# Patient Record
Sex: Male | Born: 1990 | Race: Black or African American | Hispanic: No | Marital: Single | Smoking: Current every day smoker
Health system: Southern US, Community
[De-identification: ages and names within clinical notes are randomized; demographics above are authoritative.]

## PROBLEM LIST (undated history)

## (undated) DIAGNOSIS — J189 Pneumonia, unspecified organism: Secondary | ICD-10-CM

## (undated) DIAGNOSIS — G43909 Migraine, unspecified, not intractable, without status migrainosus: Secondary | ICD-10-CM

## (undated) DIAGNOSIS — Z21 Asymptomatic human immunodeficiency virus [HIV] infection status: Secondary | ICD-10-CM

## (undated) DIAGNOSIS — Z8709 Personal history of other diseases of the respiratory system: Secondary | ICD-10-CM

## (undated) DIAGNOSIS — B20 Human immunodeficiency virus [HIV] disease: Secondary | ICD-10-CM

## (undated) DIAGNOSIS — Z87442 Personal history of urinary calculi: Secondary | ICD-10-CM

## (undated) DIAGNOSIS — J45909 Unspecified asthma, uncomplicated: Secondary | ICD-10-CM

## (undated) DIAGNOSIS — R0600 Dyspnea, unspecified: Secondary | ICD-10-CM

## (undated) HISTORY — PX: NO PAST SURGERIES: SHX2092

---

## 2018-01-07 ENCOUNTER — Encounter (HOSPITAL_COMMUNITY): Payer: Self-pay | Admitting: Emergency Medicine

## 2018-01-07 ENCOUNTER — Emergency Department (HOSPITAL_COMMUNITY)
Admission: EM | Admit: 2018-01-07 | Discharge: 2018-01-08 | Disposition: A | Discharge status: Home / Self Care | Payer: Self-pay | Attending: Emergency Medicine | Admitting: Emergency Medicine

## 2018-01-07 ENCOUNTER — Other Ambulatory Visit: Payer: Self-pay

## 2018-01-07 DIAGNOSIS — N200 Calculus of kidney: Secondary | ICD-10-CM

## 2018-01-07 DIAGNOSIS — Z21 Asymptomatic human immunodeficiency virus [HIV] infection status: Secondary | ICD-10-CM

## 2018-01-07 DIAGNOSIS — J45909 Unspecified asthma, uncomplicated: Secondary | ICD-10-CM | POA: Insufficient documentation

## 2018-01-07 DIAGNOSIS — R319 Hematuria, unspecified: Secondary | ICD-10-CM

## 2018-01-07 DIAGNOSIS — B20 Human immunodeficiency virus [HIV] disease: Secondary | ICD-10-CM

## 2018-01-07 DIAGNOSIS — Z8619 Personal history of other infectious and parasitic diseases: Secondary | ICD-10-CM | POA: Insufficient documentation

## 2018-01-07 DIAGNOSIS — F17210 Nicotine dependence, cigarettes, uncomplicated: Secondary | ICD-10-CM | POA: Insufficient documentation

## 2018-01-07 DIAGNOSIS — N3001 Acute cystitis with hematuria: Secondary | ICD-10-CM

## 2018-01-07 HISTORY — DX: Unspecified asthma, uncomplicated: J45.909

## 2018-01-07 HISTORY — DX: Human immunodeficiency virus (HIV) disease: B20

## 2018-01-07 HISTORY — DX: Asymptomatic human immunodeficiency virus (hiv) infection status: Z21

## 2018-01-07 HISTORY — DX: Migraine, unspecified, not intractable, without status migrainosus: G43.909

## 2018-01-07 LAB — URINALYSIS, ROUTINE W REFLEX MICROSCOPIC
BACTERIA UA: NONE SEEN
Squamous Epithelial / LPF: NONE SEEN

## 2018-01-07 NOTE — ED Triage Notes (Signed)
Pt reports hematuria started today. Denies dysuria, abd pain, testicle swelling, N/V/D. Pt states he has HIV.

## 2018-01-08 ENCOUNTER — Emergency Department (HOSPITAL_COMMUNITY): Payer: Self-pay

## 2018-01-08 MED ORDER — CIPROFLOXACIN HCL 500 MG PO TABS: 500.0000 mg | ORAL_TABLET | Freq: Two times a day (BID) | ORAL | 0 refills | Status: AC

## 2018-01-08 MED ORDER — HYDROCODONE-ACETAMINOPHEN 5-325 MG PO TABS: 1.0000 | ORAL_TABLET | Freq: Four times a day (QID) | ORAL | 0 refills | Status: AC | PRN

## 2018-01-08 NOTE — Discharge Instructions (Signed)
Cipro as prescribed.  Hydrocodone is prescribed as needed for pain.  Plenty of fluids.  Follow-up with urology if not improving in the next 3 days.  The contact information for [facility] has been provided in this discharge summary for you to call and make these arrangements.  Return to the ER if symptoms significantly worsen or change.

## 2018-01-08 NOTE — ED Provider Notes (Signed)
[facility] EMERGENCY DEPARTMENT Provider Note   CSN: [id number] Arrival date & time: 01/07/18  2140     History   Chief Complaint Chief Complaint  Patient presents with  . Hematuria    HPI John Navarro is a 27 y.o. male.  Patient is a 27 year old male with past medical history of HIV disease.  He presents today for evaluation of hematuria.  Yesterday he states he urinated and noticed that his urine was red in color.  He denies any burning with urination.  He denies any fevers or chills.  He does report an episode of back pain yesterday which has since resolved.   The history is provided by the patient.  Hematuria  This is a new problem. The current episode started yesterday. Episode frequency: Every urination. Nothing aggravates the symptoms. Nothing relieves the symptoms. He has tried nothing for the symptoms.    Past Medical History:  Diagnosis Date  . Asthma   . HIV (human immunodeficiency virus infection) (HCC)   . Migraines     There are no active problems to display for this patient.   History reviewed. No pertinent surgical history.     Home Medications    Prior to Admission medications   Not on File    Family History History reviewed. No pertinent family history.  Social History Social History   Tobacco Use  . Smoking status: Current Every Day Smoker    Packs/day: 0.50    Types: Cigarettes  . Smokeless tobacco: Never Used  Substance Use Topics  . Alcohol use: Yes    Alcohol/week: 1.2 oz    Types: 2 Shots of liquor per week  . Drug use: Yes    Frequency: 2.0 times per week    Types: Marijuana     Allergies   Latex   Review of Systems Review of Systems  Genitourinary: Positive for hematuria.  All other systems reviewed and are negative.    Physical Exam Updated Vital Signs BP 116/82 (BP Location: Left Arm)   Pulse 94   Temp 98.5 F (36.9 C) (Oral)   Resp 18   Ht 5\' 6"  (1.676 m)   Wt 78.4 kg (172 lb 14.4 oz)   SpO2 98%    BMI 27.91 kg/m   Physical Exam  Constitutional: He is oriented to person, place, and time. He appears well-developed and well-nourished. No distress.  HENT:  Head: Normocephalic and atraumatic.  Mouth/Throat: Oropharynx is clear and moist.  Neck: Normal range of motion. Neck supple.  Cardiovascular: Normal rate and regular rhythm. Exam reveals no friction rub.  No murmur heard. Pulmonary/Chest: Effort normal and breath sounds normal. No respiratory distress. He has no wheezes. He has no rales.  Abdominal: Soft. Bowel sounds are normal. He exhibits no distension. There is no tenderness.  Musculoskeletal: Normal range of motion. He exhibits no edema.  Neurological: He is alert and oriented to person, place, and time. Coordination normal.  Skin: Skin is warm and dry. He is not diaphoretic.  Nursing note and vitals reviewed.    ED Treatments / Results  Labs (all labs ordered are listed, but only abnormal results are displayed) Labs Reviewed  URINALYSIS, ROUTINE W REFLEX MICROSCOPIC - Abnormal; Notable for the following components:      Result Value   Color, Urine RED (*)    APPearance TURBID (*)    Glucose, UA   (*)    Value: TEST NOT REPORTED DUE TO COLOR INTERFERENCE OF URINE PIGMENT   Hgb  urine dipstick   (*)    Value: TEST NOT REPORTED DUE TO COLOR INTERFERENCE OF URINE PIGMENT   Bilirubin Urine   (*)    Value: TEST NOT REPORTED DUE TO COLOR INTERFERENCE OF URINE PIGMENT   Ketones, ur   (*)    Value: TEST NOT REPORTED DUE TO COLOR INTERFERENCE OF URINE PIGMENT   Protein, ur   (*)    Value: TEST NOT REPORTED DUE TO COLOR INTERFERENCE OF URINE PIGMENT   Nitrite   (*)    Value: TEST NOT REPORTED DUE TO COLOR INTERFERENCE OF URINE PIGMENT   Leukocytes, UA   (*)    Value: TEST NOT REPORTED DUE TO COLOR INTERFERENCE OF URINE PIGMENT   All other components within normal limits  URINE CULTURE    EKG  EKG Interpretation None       Radiology No results  found.  Procedures Procedures (including critical care time)  Medications Ordered in ED Medications - No data to display   Initial Impression / Assessment and Plan / ED Course  I have reviewed the triage vital signs and the nursing notes.  Pertinent labs & imaging results that were available during my care of the patient were reviewed by me and considered in my medical decision making (see chart for details).  CT scan shows no obstructive uropathy.  Urinalysis most consistent with a hemorrhagic cystitis.  This will be treated with antibiotics and follow-up as needed.  Final Clinical Impressions(s) / ED Diagnoses   Final diagnoses:  None    ED Discharge Orders    None       Geoffery Lyons, MD 01/08/18 [phone]

## 2018-01-09 LAB — URINE CULTURE

## 2018-01-24 ENCOUNTER — Encounter (HOSPITAL_COMMUNITY): Payer: Self-pay | Admitting: Emergency Medicine

## 2018-01-24 ENCOUNTER — Emergency Department (HOSPITAL_COMMUNITY)
Admission: EM | Admit: 2018-01-24 | Discharge: 2018-01-24 | Disposition: A | Discharge status: Home / Self Care | Payer: Self-pay | Attending: Emergency Medicine | Admitting: Emergency Medicine

## 2018-01-24 ENCOUNTER — Other Ambulatory Visit: Payer: Self-pay

## 2018-01-24 ENCOUNTER — Emergency Department (HOSPITAL_COMMUNITY): Payer: Self-pay

## 2018-01-24 DIAGNOSIS — F17210 Nicotine dependence, cigarettes, uncomplicated: Secondary | ICD-10-CM | POA: Insufficient documentation

## 2018-01-24 DIAGNOSIS — M791 Myalgia, unspecified site: Secondary | ICD-10-CM | POA: Insufficient documentation

## 2018-01-24 DIAGNOSIS — B20 Human immunodeficiency virus [HIV] disease: Secondary | ICD-10-CM | POA: Insufficient documentation

## 2018-01-24 DIAGNOSIS — R0981 Nasal congestion: Secondary | ICD-10-CM | POA: Insufficient documentation

## 2018-01-24 DIAGNOSIS — R110 Nausea: Secondary | ICD-10-CM | POA: Insufficient documentation

## 2018-01-24 DIAGNOSIS — R319 Hematuria, unspecified: Secondary | ICD-10-CM | POA: Insufficient documentation

## 2018-01-24 DIAGNOSIS — R61 Generalized hyperhidrosis: Secondary | ICD-10-CM | POA: Insufficient documentation

## 2018-01-24 DIAGNOSIS — R6883 Chills (without fever): Secondary | ICD-10-CM | POA: Insufficient documentation

## 2018-01-24 DIAGNOSIS — J40 Bronchitis, not specified as acute or chronic: Secondary | ICD-10-CM | POA: Insufficient documentation

## 2018-01-24 DIAGNOSIS — F1210 Cannabis abuse, uncomplicated: Secondary | ICD-10-CM | POA: Insufficient documentation

## 2018-01-24 DIAGNOSIS — Z91040 Latex allergy status: Secondary | ICD-10-CM | POA: Insufficient documentation

## 2018-01-24 LAB — URINALYSIS, ROUTINE W REFLEX MICROSCOPIC
BILIRUBIN URINE: NEGATIVE
Bacteria, UA: NONE SEEN
GLUCOSE, UA: NEGATIVE mg/dL
KETONES UR: 5 mg/dL — AB
Nitrite: NEGATIVE
PH: 5 (ref 5.0–8.0)
PROTEIN: 30 mg/dL — AB
Specific Gravity, Urine: 1.028 (ref 1.005–1.030)

## 2018-01-24 MED ORDER — ALBUTEROL SULFATE HFA 108 (90 BASE) MCG/ACT IN AERS
INHALATION_SPRAY | RESPIRATORY_TRACT | Status: AC
  Filled 2018-01-24: qty 6.7

## 2018-01-24 MED ORDER — ALBUTEROL SULFATE HFA 108 (90 BASE) MCG/ACT IN AERS
2.0000 | INHALATION_SPRAY | Freq: Once | RESPIRATORY_TRACT | Status: AC
  Administered 2018-01-24: 2 via RESPIRATORY_TRACT

## 2018-01-24 MED ORDER — BENZONATATE 100 MG PO CAPS: 200.0000 mg | ORAL_CAPSULE | Freq: Three times a day (TID) | ORAL | 0 refills | Status: AC | PRN

## 2018-01-24 MED ORDER — PREDNISONE 20 MG PO TABS: 40.0000 mg | ORAL_TABLET | Freq: Every day | ORAL | 0 refills | Status: AC

## 2018-01-24 NOTE — Discharge Instructions (Signed)
1-2 puffs of the albuterol inhaler every 4-6 hours as needed.  Drink plenty of fluids.  Tylenol ibuprofen if needed for fever and/or body aches.  As discussed, contact the urologist group listed to arrange a follow-up appointment.

## 2018-01-24 NOTE — ED Triage Notes (Signed)
Flu like symptoms since Monday. Nasal congestion started first, coughing and now nausea and bodyaches.

## 2018-01-25 NOTE — ED Provider Notes (Signed)
[facility] EMERGENCY DEPARTMENT Provider Note   CSN: [id number] Arrival date & time: 01/24/18  1041     History   Chief Complaint Chief Complaint  Patient presents with  . Cough    flu like symptoms    HPI John Navarro is a 28 y.o. male.  HPI  John Navarro is a 27 y.o. male who presents to the Emergency Department complaining of nasal congestion, cough, generalized body aches and nausea.  Symptoms present for 2 days.  Cough has been non-productive.  Possible sick contacts.  Admits to chills and intermittent sweats, but no known fever.  He has not tried any medications for symptom relief.  He denies shortness of breath, chest pain, abdominal pain, vomiting, and sore throat.  He also requests to have his urine rechecked.  He states that he was here two weeks ago and was noted to have blood in his urine and was advised to follow up with urology, but has not scheduled an appt.  Denies urinary symptoms.     Past Medical History:  Diagnosis Date  . Asthma   . HIV (human immunodeficiency virus infection) (HCC)   . Migraines     There are no active problems to display for this patient.   History reviewed. No pertinent surgical history.     Home Medications    Prior to Admission medications   Medication Sig Start Date End Date Taking? Authorizing Provider  benzonatate (TESSALON) 100 MG capsule Take 2 capsules (200 mg total) by mouth 3 (three) times daily as needed for cough. Swallow whole, do not chew 01/24/18   Essence Merle, PA-C  ciprofloxacin (CIPRO) 500 MG tablet Take 1 tablet (500 mg total) by mouth 2 (two) times daily. One po bid x 7 days 01/08/18   Geoffery Lyons, MD  HYDROcodone-acetaminophen (NORCO) 5-325 MG tablet Take 1-2 tablets by mouth every 6 (six) hours as needed. 01/08/18   Geoffery Lyons, MD  predniSONE (DELTASONE) 20 MG tablet Take 2 tablets (40 mg total) by mouth daily. For 5 days 01/24/18   Pauline Aus, PA-C    Family History History reviewed. No  pertinent family history.  Social History Social History   Tobacco Use  . Smoking status: Current Every Day Smoker    Packs/day: 0.50    Types: Cigarettes  . Smokeless tobacco: Never Used  Substance Use Topics  . Alcohol use: Yes    Alcohol/week: 1.2 oz    Types: 2 Shots of liquor per week  . Drug use: Yes    Frequency: 2.0 times per week    Types: Marijuana     Allergies   Latex   Review of Systems Review of Systems  Constitutional: Positive for chills. Negative for activity change, appetite change and fever.  HENT: Positive for congestion. Negative for facial swelling, rhinorrhea, sore throat and trouble swallowing.   Eyes: Negative for visual disturbance.  Respiratory: Positive for cough. Negative for shortness of breath, wheezing and stridor.   Cardiovascular: Negative for chest pain.  Gastrointestinal: Positive for nausea. Negative for abdominal pain and vomiting.  Genitourinary: Positive for hematuria. Negative for dysuria and frequency.  Musculoskeletal: Positive for myalgias. Negative for neck pain and neck stiffness.  Skin: Negative for rash.  Neurological: Negative for dizziness, weakness, numbness and headaches.  Hematological: Negative for adenopathy.  Psychiatric/Behavioral: Negative for confusion.  All other systems reviewed and are negative.    Physical Exam Updated Vital Signs BP 126/84 (BP Location: Left Arm)   Pulse (!) 101  Temp 98.8 F (37.1 C) (Oral)   Resp 18   Ht 5' 6.5" (1.689 m)   Wt 78 kg (172 lb)   SpO2 98%   BMI 27.35 kg/m   Physical Exam  Constitutional: He is oriented to person, place, and time. He appears well-developed and well-nourished. No distress.  HENT:  Head: Normocephalic and atraumatic.  Right Ear: Tympanic membrane and ear canal normal.  Left Ear: Tympanic membrane and ear canal normal.  Nose: Mucosal edema and rhinorrhea present.  Mouth/Throat: Uvula is midline and mucous membranes are normal. No trismus in the  jaw. No uvula swelling. No oropharyngeal exudate, posterior oropharyngeal edema, posterior oropharyngeal erythema or tonsillar abscesses.  Eyes: Conjunctivae are normal.  Neck: Normal range of motion and phonation normal. Neck supple. No Kernig's sign noted.  Cardiovascular: Normal rate, regular rhythm and intact distal pulses.  No murmur heard. Pulmonary/Chest: Effort normal and breath sounds normal. No respiratory distress. He has no wheezes. He has no rales.  Abdominal: Soft. He exhibits no distension. There is no tenderness. There is no rebound, no guarding and no CVA tenderness.  Musculoskeletal: Normal range of motion. He exhibits no edema.  Lymphadenopathy:    He has no cervical adenopathy.  Neurological: He is alert and oriented to person, place, and time. No sensory deficit. He exhibits normal muscle tone. Coordination normal.  Skin: Skin is warm and dry.  Psychiatric: He has a normal mood and affect.  Nursing note and vitals reviewed.    ED Treatments / Results  Labs (all labs ordered are listed, but only abnormal results are displayed) Labs Reviewed  URINALYSIS, ROUTINE W REFLEX MICROSCOPIC - Abnormal; Notable for the following components:      Result Value   Hgb urine dipstick MODERATE (*)    Ketones, ur 5 (*)    Protein, ur 30 (*)    Leukocytes, UA TRACE (*)    Squamous Epithelial / LPF 0-5 (*)    All other components within normal limits  URINE CULTURE    EKG  EKG Interpretation None       Radiology Dg Chest 2 View  Result Date: 01/24/2018 CLINICAL DATA:  Cough, congestion, sob, body aches and fever x 2 days. EXAM: CHEST  2 VIEW COMPARISON:  None. FINDINGS: Heart size is normal. There is mild perihilar peribronchial thickening. No focal consolidations or pleural effusions. No pulmonary edema. IMPRESSION: Mild bronchitic changes. Electronically Signed   By: Norva Pavlov M.D.   On: 01/24/2018 11:23    Procedures Procedures (including critical care  time)  Medications Ordered in ED Medications  albuterol (PROVENTIL HFA;VENTOLIN HFA) 108 (90 Base) MCG/ACT inhaler 2 puff (2 puffs Inhalation Given 01/24/18 1305)     Initial Impression / Assessment and Plan / ED Course  I have reviewed the triage vital signs and the nursing notes.  Pertinent labs & imaging results that were available during my care of the patient were reviewed by me and considered in my medical decision making (see chart for details).     Pt is well appearing.  Vitals reviewed.  CXR shows bronchitic changes.  U/A performed at pt request.  Continues to have painless hematuria.  Advised pt of importance of urology f/u.  He verbalized understanding and agrees to arrange f/u.    Final Clinical Impressions(s) / ED Diagnoses   Final diagnoses:  Bronchitis  Hematuria, unspecified type    ED Discharge Orders        Ordered    predniSONE (DELTASONE) 20  MG tablet  Daily     01/24/18 1255    benzonatate (TESSALON) 100 MG capsule  3 times daily PRN     01/24/18 1255       Christoper Bushey, Babette Relic, PA-C 01/25/18 2219    Bethann Berkshire, MD 01/26/18 0830

## 2018-01-26 LAB — URINE CULTURE

## 2018-02-19 ENCOUNTER — Emergency Department (HOSPITAL_COMMUNITY)
Admission: EM | Admit: 2018-02-19 | Discharge: 2018-02-19 | Disposition: A | Discharge status: Home / Self Care | Payer: Self-pay | Attending: Emergency Medicine | Admitting: Emergency Medicine

## 2018-02-19 ENCOUNTER — Encounter (HOSPITAL_COMMUNITY): Payer: Self-pay | Admitting: Emergency Medicine

## 2018-02-19 DIAGNOSIS — R070 Pain in throat: Secondary | ICD-10-CM | POA: Insufficient documentation

## 2018-02-19 DIAGNOSIS — R0981 Nasal congestion: Secondary | ICD-10-CM | POA: Insufficient documentation

## 2018-02-19 DIAGNOSIS — Z79899 Other long term (current) drug therapy: Secondary | ICD-10-CM | POA: Insufficient documentation

## 2018-02-19 DIAGNOSIS — F17210 Nicotine dependence, cigarettes, uncomplicated: Secondary | ICD-10-CM | POA: Insufficient documentation

## 2018-02-19 DIAGNOSIS — J45909 Unspecified asthma, uncomplicated: Secondary | ICD-10-CM | POA: Insufficient documentation

## 2018-02-19 DIAGNOSIS — J029 Acute pharyngitis, unspecified: Secondary | ICD-10-CM

## 2018-02-19 DIAGNOSIS — Z91040 Latex allergy status: Secondary | ICD-10-CM | POA: Insufficient documentation

## 2018-02-19 DIAGNOSIS — B20 Human immunodeficiency virus [HIV] disease: Secondary | ICD-10-CM | POA: Insufficient documentation

## 2018-02-19 LAB — RAPID STREP SCREEN (MED CTR MEBANE ONLY): STREPTOCOCCUS, GROUP A SCREEN (DIRECT): NEGATIVE

## 2018-02-19 MED ORDER — AMOXICILLIN 500 MG PO CAPS: 500.0000 mg | ORAL_CAPSULE | Freq: Two times a day (BID) | ORAL | 0 refills | Status: AC

## 2018-02-19 MED ORDER — AMOXICILLIN 500 MG PO CAPS
500.0000 mg | ORAL_CAPSULE | Freq: Once | ORAL | Status: AC
Start: 2018-02-19 — End: 2018-02-19
  Administered 2018-02-19: 500 mg via ORAL
  Filled 2018-02-19: qty 1

## 2018-02-19 MED ORDER — DEXAMETHASONE SODIUM PHOSPHATE 10 MG/ML IJ SOLN
10.0000 mg | Freq: Once | INTRAMUSCULAR | Status: AC
  Administered 2018-02-19: 10 mg via INTRAMUSCULAR
  Filled 2018-02-19: qty 1

## 2018-02-19 MED ORDER — FLUTICASONE PROPIONATE 50 MCG/ACT NA SUSP: 2.0000 | Freq: Every day | NASAL | 0 refills | Status: AC

## 2018-02-19 NOTE — Discharge Instructions (Signed)
Please take all of your antibiotics until finished!   You may develop abdominal discomfort or diarrhea from the antibiotic.  You may help offset this with probiotics which you can buy or get in yogurt. Do not eat  or take the probiotics until 2 hours after your antibiotic.   Plenty of water and get plenty of rest. Gargle warm salt water and spit it out for sore throat. May also use cough drops, warm teas, over-the-counter sprays such as Chloraseptic spray, etc. Take flonase to decrease nasal congestion.  Over-the-counter cold or allergy medication for nasal congestion and scratchy throat. Alternate 600 mg of ibuprofen and [phone] mg of Tylenol every 3 hours as needed for pain. Do not exceed 4000 mg of Tylenol daily.   Followup with your primary care doctor or infectious disease doctor in 3-5 days for recheck of ongoing symptoms. Return to emergency department for emergent changing or worsening of symptoms such as throat tightness, facial swelling, not able to tolerate fluids, fever not controlled by ibuprofen or Tylenol,difficulty breathing, or chest pain.

## 2018-02-19 NOTE — ED Notes (Signed)
Provider notified specimen unable to be found by lab after being sent by nurse via tube station.

## 2018-02-19 NOTE — ED Triage Notes (Signed)
Pt complains of sore throat for three days. Pt states his "body hurts". Pt also complains of nasal congestion/ nonproductive cough

## 2018-02-19 NOTE — ED Notes (Signed)
Called lab states has not received specimen for strep test.

## 2018-02-19 NOTE — ED Provider Notes (Signed)
[facility] EMERGENCY DEPARTMENT Provider Note   CSN: [id number] Arrival date & time: 02/19/18  1147     History   Chief Complaint Chief Complaint  Patient presents with  . Sore Throat    HPI John Navarro is a 27 y.o. male with multiple asthma, HIV, migraines presents today for evaluation of gradual onset, progressively worsening sore throat for 5 days.  Patient notes he developed a hoarse voice last night.  He thinks this was as a result of yelling and singing in church yesterday morning. No drooling while awake.  He states that his pain was most severe when his symptoms began on Thursday and Friday but have improved.  He denies fevers or chills.  He notes nasal congestion and nonproductive cough.  Denies chest pain or shortness of breath.  He has tried hot tea and a shot of liquor without significant relief of his symptoms.  He notes generalized myalgias but denies neck stiffness.  He states he is able to tolerate p.o. fluids although it is painful to swallow.  His infectious disease doctor is in [place] and he states he has been largely compliant with his HIV medications.  He was last evaluated by them in November and his viral load has been undetectable for years per the patient.  The history is provided by the patient.    Past Medical History:  Diagnosis Date  . Asthma   . HIV (human immunodeficiency virus infection) (HCC)   . Migraines     There are no active problems to display for this patient.   History reviewed. No pertinent surgical history.      Home Medications    Prior to Admission medications   Medication Sig Start Date End Date Taking? Authorizing Provider  amoxicillin (AMOXIL) 500 MG capsule Take 1 capsule (500 mg total) by mouth 2 (two) times daily for 10 days. 02/19/18 03/01/18  Michela Pitcher A, PA-C  benzonatate (TESSALON) 100 MG capsule Take 2 capsules (200 mg total) by mouth 3 (three) times daily as needed for cough. Swallow whole, do  not chew 01/24/18   Triplett, Tammy, PA-C  ciprofloxacin (CIPRO) 500 MG tablet Take 1 tablet (500 mg total) by mouth 2 (two) times daily. One po bid x 7 days 01/08/18   Geoffery Lyons, MD  fluticasone [facility]) 50 MCG/ACT nasal spray Place 2 sprays into both nostrils daily. 02/19/18   Maurice Ramseur A, PA-C  HYDROcodone-acetaminophen (NORCO) 5-325 MG tablet Take 1-2 tablets by mouth every 6 (six) hours as needed. 01/08/18   Geoffery Lyons, MD  predniSONE (DELTASONE) 20 MG tablet Take 2 tablets (40 mg total) by mouth daily. For 5 days 01/24/18   Pauline Aus, PA-C    Family History History reviewed. No pertinent family history.  Social History Social History   Tobacco Use  . Smoking status: Current Every Day Smoker    Packs/day: 0.50    Types: Cigarettes  . Smokeless tobacco: Never Used  Substance Use Topics  . Alcohol use: Yes    Alcohol/week: 1.2 oz    Types: 2 Shots of liquor per week  . Drug use: Yes    Frequency: 2.0 times per week    Types: Marijuana     Allergies   Latex   Review of Systems Review of Systems  Constitutional: Negative for chills and fever.  HENT: Positive for congestion, drooling, sore throat, trouble swallowing and voice change.   Respiratory: Positive for cough. Negative for shortness of breath.   Cardiovascular:  Negative for chest pain.  Gastrointestinal: Negative for abdominal pain, nausea and vomiting.  Musculoskeletal: Negative for neck pain and neck stiffness.     Physical Exam Updated Vital Signs BP 119/69   Pulse 92   Temp 98.5 F (36.9 C) (Oral)   Resp 16   Ht 5\' 6"  (1.676 m)   Wt 78 kg (172 lb)   SpO2 100%   BMI 27.76 kg/m   Physical Exam  Constitutional: He appears well-developed and well-nourished. No distress.  HENT:  Head: Normocephalic and atraumatic.  Right Ear: Tympanic membrane and ear canal normal.  Left Ear: Tympanic membrane and ear canal normal.  Mouth/Throat: Uvula is midline and mucous membranes are normal. No uvula  swelling. Posterior oropharyngeal edema and posterior oropharyngeal erythema present. No tonsillar abscesses. Tonsils are 2+ on the right. Tonsils are 2+ on the left. Tonsillar exudate.  Nasal septum midline with mucosal edema bilaterally.  No frontal or maxillary sinus tenderness.  No swelling of the lips or tongue.  Tolerating secretions without difficulty.  Speaking with a hoarse soft voice.  Eyes: Pupils are equal, round, and reactive to light. Conjunctivae and EOM are normal. Right eye exhibits no discharge. Left eye exhibits no discharge.  Neck: Normal range of motion. Neck supple. No JVD present. No tracheal deviation present.  Bilateral anterior cervical lymphadenopathy.  He experiences some pain to the anterior neck with extension and lateral rotation.  No stridor on auscultation  Cardiovascular: Normal rate, regular rhythm and normal heart sounds.  Pulmonary/Chest: Effort normal and breath sounds normal. No respiratory distress. He has no wheezes. He has no rhonchi. He has no rales. He exhibits no tenderness.  Equal rise and fall of chest, no increased work of breathing  Abdominal: Soft. Bowel sounds are normal. He exhibits no distension.  Musculoskeletal: He exhibits no edema.  Lymphadenopathy:    He has cervical adenopathy.  Neurological: He is alert.  Skin: Skin is warm and dry. No erythema.  Psychiatric: He has a normal mood and affect. His behavior is normal.  Nursing note and vitals reviewed.    ED Treatments / Results  Labs (all labs ordered are listed, but only abnormal results are displayed) Labs Reviewed  RAPID STREP SCREEN (NOT AT [facility])    EKG None  Radiology No results found.  Procedures Procedures (including critical care time)  Medications Ordered in ED Medications  dexamethasone (DECADRON) injection 10 mg (10 mg Intramuscular Given 02/19/18 1358)  amoxicillin (AMOXIL) capsule 500 mg (500 mg Oral Given 02/19/18 1407)     Initial Impression / Assessment  and Plan / ED Course  I have reviewed the triage vital signs and the nursing notes.  Pertinent labs & imaging results that were available during my care of the patient were reviewed by me and considered in my medical decision making (see chart for details).     Patient presents with 4-day history of sore throat and nasal congestion.  Pain is improving but he did develop some hoarseness to his voice yesterday after singing at church.  He is afebrile, vital signs are stable.  He is nontoxic in appearance.  He does speak with a soft hoarse voice but is tolerating secretions without difficulty and has no upper airway stridor.  He has tonsillar exudate, cervical lymphadenopathy, & dysphagia; diagnosis of strep. Treated in the Ed with steroids.  He exhibits no meningeal signs and I doubt meningitis.  Presentation is not concerning for PTA.  However, with history of some drooling at night  and hoarse voice which developed yesterday I did recommend CT scan of the neck for evaluation of spread of infection to soft tissue.  Patient refuses scan at this time.  I did have an extensive discussion regarding the risks of not working up his sore throat entirely and he verbalizes understanding of the assumed risk.  He would prefer to hold off on imaging at this time.  He is HIV positive but states he has been largely compliant with his HIV medications and his viral load has been undetectable for several years. He is able to tolerate secretions without difficulty and is speaking in full sentences and protecting his airway. Pt appears mildly dehydrated, discussed importance of water rehydration. No trismus or uvula deviation.  Discussed strict ED return precautions.  Pt able to drink water in ED without difficulty with intact air way. Recommended PCP follow up and gave [place]  Patient and patient's significant other verbalized understanding of and agreement with plan and  patient is stable for discharge home at this time.  Final Clinical Impressions(s) / ED Diagnoses   Final diagnoses:  Sore throat  Nasal congestion    ED Discharge Orders        Ordered    amoxicillin (AMOXIL) 500 MG capsule  2 times daily     02/19/18 1404    fluticasone (FLONASE) 50 MCG/ACT nasal spray  Daily     02/19/18 1404       Jeanie SewerFawze, Amali Uhls A, PA-C 02/19/18 1444    Eber HongMiller, Brian, MD 02/19/18 2135

## 2018-02-21 LAB — CULTURE, GROUP A STREP (THRC)

## 2018-02-23 ENCOUNTER — Other Ambulatory Visit: Payer: Self-pay

## 2018-03-02 ENCOUNTER — Encounter: Payer: Self-pay | Admitting: Infectious Diseases

## 2018-03-12 ENCOUNTER — Ambulatory Visit: Payer: Self-pay | Admitting: Infectious Diseases

## 2018-03-12 ENCOUNTER — Ambulatory Visit: Payer: Self-pay

## 2018-03-14 ENCOUNTER — Telehealth: Payer: Self-pay

## 2018-03-14 ENCOUNTER — Ambulatory Visit: Payer: Self-pay

## 2018-03-14 ENCOUNTER — Ambulatory Visit: Payer: Self-pay | Admitting: Infectious Diseases

## 2018-03-14 NOTE — Telephone Encounter (Signed)
Called pt today to remind him of his appt with Dr. Ninetta LightsHatcher. Left a brief message reminding him of his appt and left clinics phone number incase he needed directions or had questions for the office.  Lorenso CourierJose L Maldonado, [place]

## 2018-03-22 ENCOUNTER — Other Ambulatory Visit: Payer: Self-pay | Admitting: *Deleted

## 2018-03-22 ENCOUNTER — Other Ambulatory Visit: Payer: Self-pay

## 2018-03-22 DIAGNOSIS — B20 Human immunodeficiency virus [HIV] disease: Secondary | ICD-10-CM

## 2018-03-22 DIAGNOSIS — Z113 Encounter for screening for infections with a predominantly sexual mode of transmission: Secondary | ICD-10-CM

## 2018-03-22 DIAGNOSIS — Z79899 Other long term (current) drug therapy: Secondary | ICD-10-CM

## 2018-03-25 ENCOUNTER — Emergency Department (HOSPITAL_COMMUNITY)
Admission: EM | Admit: 2018-03-25 | Discharge: 2018-03-25 | Disposition: A | Discharge status: Home / Self Care | Payer: Self-pay | Attending: Emergency Medicine | Admitting: Emergency Medicine

## 2018-03-25 ENCOUNTER — Other Ambulatory Visit: Payer: Self-pay

## 2018-03-25 ENCOUNTER — Encounter (HOSPITAL_COMMUNITY): Payer: Self-pay | Admitting: Emergency Medicine

## 2018-03-25 DIAGNOSIS — Z79899 Other long term (current) drug therapy: Secondary | ICD-10-CM | POA: Insufficient documentation

## 2018-03-25 DIAGNOSIS — F17210 Nicotine dependence, cigarettes, uncomplicated: Secondary | ICD-10-CM | POA: Insufficient documentation

## 2018-03-25 DIAGNOSIS — Z87442 Personal history of urinary calculi: Secondary | ICD-10-CM

## 2018-03-25 DIAGNOSIS — J45909 Unspecified asthma, uncomplicated: Secondary | ICD-10-CM | POA: Insufficient documentation

## 2018-03-25 DIAGNOSIS — R109 Unspecified abdominal pain: Secondary | ICD-10-CM

## 2018-03-25 DIAGNOSIS — N390 Urinary tract infection, site not specified: Secondary | ICD-10-CM | POA: Insufficient documentation

## 2018-03-25 LAB — BASIC METABOLIC PANEL
ANION GAP: 10 (ref 5–15)
BUN: 9 mg/dL (ref 6–20)
CHLORIDE: 104 mmol/L (ref 101–111)
CO2: 23 mmol/L (ref 22–32)
Calcium: 8.3 mg/dL — ABNORMAL LOW (ref 8.9–10.3)
Creatinine, Ser: 1.09 mg/dL (ref 0.61–1.24)
GFR calc Af Amer: >60 mL/min (ref >60)
GFR calc non Af Amer: >60 mL/min (ref >60)
GLUCOSE: 128 mg/dL — AB (ref 65–99)
POTASSIUM: 3.7 mmol/L (ref 3.5–5.1)
SODIUM: 137 mmol/L (ref 135–145)

## 2018-03-25 LAB — CBC
HEMATOCRIT: 40.4 % (ref 39.0–52.0)
HEMOGLOBIN: 13.7 g/dL (ref 13.0–17.0)
MCH: 29.4 pg (ref 26.0–34.0)
MCHC: 33.9 g/dL (ref 30.0–36.0)
MCV: 86.7 fL (ref 78.0–100.0)
Platelets: 286 10*3/uL (ref 150–400)
RBC: 4.66 MIL/uL (ref 4.22–5.81)
RDW: 13.7 % (ref 11.5–15.5)
WBC: 11.6 10*3/uL — AB (ref 4.0–10.5)

## 2018-03-25 LAB — URINALYSIS, ROUTINE W REFLEX MICROSCOPIC
Bacteria, UA: NONE SEEN
Bilirubin Urine: NEGATIVE
GLUCOSE, UA: NEGATIVE mg/dL
Ketones, ur: NEGATIVE mg/dL
NITRITE: NEGATIVE
Protein, ur: NEGATIVE mg/dL
RBC / HPF: >50 RBC/hpf — ABNORMAL HIGH (ref 0–5)
SPECIFIC GRAVITY, URINE: 1.015 (ref 1.005–1.030)
pH: 5 (ref 5.0–8.0)

## 2018-03-25 MED ORDER — TAMSULOSIN HCL 0.4 MG PO CAPS: 0.4000 mg | ORAL_CAPSULE | Freq: Every day | ORAL | 0 refills | Status: DC

## 2018-03-25 MED ORDER — DOXYCYCLINE HYCLATE 100 MG PO CAPS: 100.0000 mg | ORAL_CAPSULE | Freq: Two times a day (BID) | ORAL | 0 refills | Status: AC

## 2018-03-25 MED ORDER — IBUPROFEN 600 MG PO TABS: 600.0000 mg | ORAL_TABLET | Freq: Four times a day (QID) | ORAL | 0 refills | Status: AC | PRN

## 2018-03-25 NOTE — ED Provider Notes (Signed)
[facility] EMERGENCY DEPARTMENT Provider Note   CSN: [id number] Arrival date & time: 03/25/18  1549     History   Chief Complaint Chief Complaint  Patient presents with  . Flank Pain    HPI John Navarro is a 27 y.o. male.  HPI   27 year old male with history of HIV presenting complaining of flank pain.  Patient report last night he developed pain primarily to his left flank which radiates to his right flank.  Pain is sharp throbbing, moderate in severity and felt similar to the kidney stone that he had 2 months ago when he was diagnosed with kidney stones.  When the pain is intense, he was nauseous and vomit once.  States he saw a trace of blood in his vomitus.  He has not vomited since.  Denies any associated fever, chills, lightheadedness, dizziness, burning urination, urinary frequency or urgency, or penile discharge.  He did notice some blood in his urine.  Nothing seems to make the symptoms better or worse.  He is unable to recall last CD4 Viral load.  He also denies any penile pain or testicular pain.   Past Medical History:  Diagnosis Date  . Asthma   . HIV (human immunodeficiency virus infection) (HCC)   . Migraines     There are no active problems to display for this patient.   No past surgical history on file.      Home Medications    Prior to Admission medications   Medication Sig Start Date End Date Taking? Authorizing Provider  benzonatate (TESSALON) 100 MG capsule Take 2 capsules (200 mg total) by mouth 3 (three) times daily as needed for cough. Swallow whole, do not chew 01/24/18   Triplett, Tammy, PA-C  ciprofloxacin (CIPRO) 500 MG tablet Take 1 tablet (500 mg total) by mouth 2 (two) times daily. One po bid x 7 days 01/08/18   Geoffery Lyons, MD  fluticasone [facility]) 50 MCG/ACT nasal spray Place 2 sprays into both nostrils daily. 02/19/18   Fawze, Mina A, PA-C  HYDROcodone-acetaminophen (NORCO) 5-325 MG tablet Take 1-2 tablets by mouth  every 6 (six) hours as needed. 01/08/18   Geoffery Lyons, MD  predniSONE (DELTASONE) 20 MG tablet Take 2 tablets (40 mg total) by mouth daily. For 5 days 01/24/18   Pauline Aus, PA-C    Family History No family history on file.  Social History Social History   Tobacco Use  . Smoking status: Current Every Day Smoker    Packs/day: 0.50    Types: Cigarettes  . Smokeless tobacco: Never Used  Substance Use Topics  . Alcohol use: Yes    Alcohol/week: 1.2 oz    Types: 2 Shots of liquor per week  . Drug use: Yes    Frequency: 2.0 times per week    Types: Marijuana     Allergies   Latex   Review of Systems Review of Systems  All other systems reviewed and are negative.    Physical Exam Updated Vital Signs BP (!) 128/91 (BP Location: Right Arm)   Pulse 72   Temp 98.6 F (37 C) (Oral)   Resp 18   Ht  (1.702 m)   Wt 74.8 kg (165 lb)   SpO2 99%   BMI 25.84 kg/m   Physical Exam  Constitutional: He appears well-developed and well-nourished. No distress.  HENT:  Head: Atraumatic.  Eyes: Conjunctivae are normal.  Neck: Neck supple.  Cardiovascular: Normal rate and regular rhythm.  Pulmonary/Chest: Effort  normal and breath sounds normal.  Abdominal: Soft. Bowel sounds are normal. He exhibits no distension. There is no tenderness.  Genitourinary:  Genitourinary Comments: Left CVA tenderness on percussion  Neurological: He is alert.  Skin: No rash noted.  Psychiatric: He has a normal mood and affect.  Nursing note and vitals reviewed.    ED Treatments / Results  Labs (all labs ordered are listed, but only abnormal results are displayed) Labs Reviewed  URINALYSIS, ROUTINE W REFLEX MICROSCOPIC - Abnormal; Notable for the following components:      Result Value   Hgb urine dipstick MODERATE (*)    Leukocytes, UA SMALL (*)    RBC / HPF >50 (*)    All other components within normal limits  BASIC METABOLIC PANEL - Abnormal; Notable for the following components:    Glucose, Bld 128 (*)    Calcium 8.3 (*)    All other components within normal limits  CBC - Abnormal; Notable for the following components:   WBC 11.6 (*)    All other components within normal limits  URINE CULTURE    EKG None  Radiology No results found.  Procedures Procedures (including critical care time)  Medications Ordered in ED Medications - No data to display   Initial Impression / Assessment and Plan / ED Course  I have reviewed the triage vital signs and the nursing notes.  Pertinent labs & imaging results that were available during my care of the patient were reviewed by me and considered in my medical decision making (see chart for details).     BP (!) 128/91 (BP Location: Right Arm)   Pulse 72   Temp 98.6 F (37 C) (Oral)   Resp 18   Ht  (1.702 m)   Wt 74.8 kg (165 lb)   SpO2 99%   BMI 25.84 kg/m    Final Clinical Impressions(s) / ED Diagnoses   Final diagnoses:  History of kidney stones  Left flank pain  Lower urinary tract infectious disease    ED Discharge Orders        Ordered    ibuprofen (ADVIL,MOTRIN) 600 MG tablet  Every 6 hours PRN     03/25/18 2036    doxycycline (VIBRAMYCIN) 100 MG capsule  2 times daily     03/25/18 2036    tamsulosin (FLOMAX) 0.4 MG CAPS capsule  Daily     03/25/18 2036     8:33 PM Patient with known history of kidney stones here with left flank pain which felt similar to prior kidney stone.  He is resting comfortably.  Urine shows moderate amount of hemoglobin and urine dipstick.  WBC 21-50, nitrite negative.  He denies any dysuria however I am concerned for potential obstructive kidney stone causing urinary tract infection vs pyelonephritis.  His renal function is normal, mildly elevated WBC of 11.6.  He also has history of HIV.  We did discuss option of renal ultrasound to assess for potential obstructive stone however, patient refused to stay for the test.  He understand the risks of not having the  tests which includes worsening of his condition.  At this time, patient will be discharged home with pain medication as well as antibiotic.  Strongly encourage patient to follow-up with [facility] for further evaluation.  Return precautions discussed.   Fayrene Helper, PA-C 03/25/18 2038    Mancel Bale, MD 03/26/18 1414

## 2018-03-25 NOTE — Discharge Instructions (Signed)
Your flank pain is concerning for potential obstructed kidney stone or kidney infection.  Please take antibiotic as prescribe.  Follow up with urologist for further care.  Return if you have any concerns.

## 2018-03-25 NOTE — ED Triage Notes (Signed)
Pt states diagnosis of kidney stone to left kidney 1 month ago. Pain went away, he though he passed it. Pain came back yesterday with emesis. Small amount of blood in urine this morning.

## 2018-04-10 ENCOUNTER — Ambulatory Visit: Payer: Self-pay

## 2018-04-10 ENCOUNTER — Ambulatory Visit: Payer: Self-pay | Admitting: Family

## 2018-04-10 DIAGNOSIS — B20 Human immunodeficiency virus [HIV] disease: Secondary | ICD-10-CM | POA: Insufficient documentation

## 2018-04-10 NOTE — Progress Notes (Deleted)
Subjective:    Patient ID: John Navarro, male    DOB: Jan 08, 1991, 27 y.o.   MRN: [id number]  No chief complaint on file.   HPI:  John Navarro is a 27 y.o. male who presents today for initial office visit to establish/transfer care of his HIV disease.  Mr. Lemmerman was initially diagnosed with HIV in 2012. Previous medication history with Complara which he discontinued secondary to vomiting and most recently was started on Biktarvy. Most recent Genotype in February 2018 was wild. He was last seen at [facility] in March 2018. He does not have immunity to Hepatitis B and has not received Pneumococcal or Meningococcal vaccinations. He was previously treated for gonorrhea. Syphilis Titer was most recently at 1:2 in March of 2018.    Allergies  Allergen Reactions  . Latex Hives      Outpatient Medications Prior to Visit  Medication Sig Dispense Refill  . benzonatate (TESSALON) 100 MG capsule Take 2 capsules (200 mg total) by mouth 3 (three) times daily as needed for cough. Swallow whole, do not chew 21 capsule 0  . ciprofloxacin (CIPRO) 500 MG tablet Take 1 tablet (500 mg total) by mouth 2 (two) times daily. One po bid x 7 days 14 tablet 0  . doxycycline (VIBRAMYCIN) 100 MG capsule Take 1 capsule (100 mg total) by mouth 2 (two) times daily. One po bid x 7 days 14 capsule 0  . fluticasone (FLONASE) 50 MCG/ACT nasal spray Place 2 sprays into both nostrils daily. 16 g 0  . HYDROcodone-acetaminophen (NORCO) 5-325 MG tablet Take 1-2 tablets by mouth every 6 (six) hours as needed. 15 tablet 0  . ibuprofen (ADVIL,MOTRIN) 600 MG tablet Take 1 tablet (600 mg total) by mouth every 6 (six) hours as needed. 30 tablet 0  . predniSONE (DELTASONE) 20 MG tablet Take 2 tablets (40 mg total) by mouth daily. For 5 days 10 tablet 0  . tamsulosin (FLOMAX) 0.4 MG CAPS capsule Take 1 capsule (0.4 mg total) by mouth daily. 10 capsule 0   No facility-administered medications prior to visit.       Past Medical History:  Diagnosis Date  . Asthma   . HIV (human immunodeficiency virus infection) (HCC)   . Migraines       No past surgical history on file.    No family history on file.    Social History   Socioeconomic History  . Marital status: Single    Spouse name: Not on file  . Number of children: Not on file  . Years of education: Not on file  . Highest education level: Not on file  Occupational History  . Not on file  Social Needs  . Financial resource strain: Not on file  . Food insecurity:    Worry: Not on file    Inability: Not on file  . Transportation needs:    Medical: Not on file    Non-medical: Not on file  Tobacco Use  . Smoking status: Current Every Day Smoker    Packs/day: 0.50    Types: Cigarettes  . Smokeless tobacco: Never Used  Substance and Sexual Activity  . Alcohol use: Yes    Alcohol/week: 1.2 oz    Types: 2 Shots of liquor per week  . Drug use: Yes    Frequency: 2.0 times per week    Types: Marijuana  . Sexual activity: Not on file  Lifestyle  . Physical activity:    Days per week: Not on file  Minutes per session: Not on file  . Stress: Not on file  Relationships  . Social connections:    Talks on phone: Not on file    Gets together: Not on file    Attends religious service: Not on file    Active member of club or organization: Not on file    Attends meetings of clubs or organizations: Not on file    Relationship status: Not on file  . Intimate partner violence:    Fear of current or ex partner: Not on file    Emotionally abused: Not on file    Physically abused: Not on file    Forced sexual activity: Not on file  Other Topics Concern  . Not on file  Social History Narrative  . Not on file      Review of Systems     Objective:    There were no vitals taken for this visit. Nursing note and vital signs reviewed.  Physical Exam      Assessment & Plan:   Problem List Items Addressed This Visit     None       I am having Hakeem Pence maintain his ciprofloxacin, HYDROcodone-acetaminophen, predniSONE, benzonatate, fluticasone, ibuprofen, doxycycline, and tamsulosin.   No orders of the defined types were placed in this encounter.    Follow-up: No follow-ups on file.  Jeanine Luz, FNP [facility]

## 2018-04-24 ENCOUNTER — Telehealth: Payer: Self-pay | Admitting: *Deleted

## 2018-04-24 NOTE — Telephone Encounter (Signed)
Received referral in EPIC for patient.  RN left generic message asking patient to call back for an appointment, ask for nurse named Marcelino Duster. He has been out of care at [facility] since 01/2017, but came to see Olegario Messier for RW/ADAP on 03/02/18. Has had multiple no-shows since then. Will refer to [facility] as well. Andree Coss, RN

## 2018-05-04 ENCOUNTER — Ambulatory Visit: Payer: Self-pay

## 2018-05-04 ENCOUNTER — Other Ambulatory Visit: Payer: Self-pay

## 2018-05-08 ENCOUNTER — Encounter (HOSPITAL_COMMUNITY): Payer: Self-pay | Admitting: Emergency Medicine

## 2018-05-08 ENCOUNTER — Other Ambulatory Visit: Payer: Self-pay

## 2018-05-08 ENCOUNTER — Emergency Department (HOSPITAL_COMMUNITY): Payer: Self-pay

## 2018-05-08 ENCOUNTER — Emergency Department (HOSPITAL_COMMUNITY)
Admission: EM | Admit: 2018-05-08 | Discharge: 2018-05-08 | Disposition: A | Discharge status: Home / Self Care | Payer: Self-pay | Attending: Urology | Admitting: Urology

## 2018-05-08 ENCOUNTER — Encounter (HOSPITAL_COMMUNITY): Admission: EM | Disposition: A | Discharge status: Home / Self Care | Payer: Self-pay | Source: Home / Self Care

## 2018-05-08 ENCOUNTER — Emergency Department (HOSPITAL_COMMUNITY): Payer: Self-pay | Admitting: Anesthesiology

## 2018-05-08 DIAGNOSIS — Z21 Asymptomatic human immunodeficiency virus [HIV] infection status: Secondary | ICD-10-CM | POA: Insufficient documentation

## 2018-05-08 DIAGNOSIS — N132 Hydronephrosis with renal and ureteral calculous obstruction: Secondary | ICD-10-CM | POA: Insufficient documentation

## 2018-05-08 DIAGNOSIS — Z791 Long term (current) use of non-steroidal anti-inflammatories (NSAID): Secondary | ICD-10-CM | POA: Insufficient documentation

## 2018-05-08 DIAGNOSIS — N201 Calculus of ureter: Secondary | ICD-10-CM

## 2018-05-08 DIAGNOSIS — Z7952 Long term (current) use of systemic steroids: Secondary | ICD-10-CM | POA: Insufficient documentation

## 2018-05-08 DIAGNOSIS — Z79899 Other long term (current) drug therapy: Secondary | ICD-10-CM | POA: Insufficient documentation

## 2018-05-08 DIAGNOSIS — F17210 Nicotine dependence, cigarettes, uncomplicated: Secondary | ICD-10-CM | POA: Insufficient documentation

## 2018-05-08 HISTORY — DX: Dyspnea, unspecified: R06.00

## 2018-05-08 HISTORY — DX: Personal history of other diseases of the respiratory system: Z87.09

## 2018-05-08 HISTORY — PX: CYSTOSCOPY W/ URETERAL STENT PLACEMENT: SHX1429

## 2018-05-08 HISTORY — DX: Pneumonia, unspecified organism: J18.9

## 2018-05-08 HISTORY — DX: Personal history of urinary calculi: Z87.442

## 2018-05-08 LAB — COMPREHENSIVE METABOLIC PANEL
ALT: 13 U/L — ABNORMAL LOW (ref 17–63)
AST: 18 U/L (ref 15–41)
Albumin: 3.8 g/dL (ref 3.5–5.0)
Alkaline Phosphatase: 54 U/L (ref 38–126)
Anion gap: 7 (ref 5–15)
BUN: 9 mg/dL (ref 6–20)
CO2: 24 mmol/L (ref 22–32)
Calcium: 9.2 mg/dL (ref 8.9–10.3)
Chloride: 110 mmol/L (ref 101–111)
Creatinine, Ser: 1.36 mg/dL — ABNORMAL HIGH (ref 0.61–1.24)
GFR calc Af Amer: >60 mL/min (ref >60)
GFR calc non Af Amer: >60 mL/min (ref >60)
Glucose, Bld: 93 mg/dL (ref 65–99)
Potassium: 3.8 mmol/L (ref 3.5–5.1)
Sodium: 141 mmol/L (ref 135–145)
Total Bilirubin: 0.6 mg/dL (ref 0.3–1.2)
Total Protein: 8.4 g/dL — ABNORMAL HIGH (ref 6.5–8.1)

## 2018-05-08 LAB — CBC WITH DIFFERENTIAL/PLATELET
Abs Immature Granulocytes: 0 10*3/uL (ref 0.0–0.1)
Basophils Absolute: 0.1 10*3/uL (ref 0.0–0.1)
Basophils Relative: 1 %
Eosinophils Absolute: 0.3 10*3/uL (ref 0.0–0.7)
Eosinophils Relative: 3 %
HCT: 42.1 % (ref 39.0–52.0)
Hemoglobin: 14.1 g/dL (ref 13.0–17.0)
Immature Granulocytes: 0 %
Lymphocytes Relative: 28 %
Lymphs Abs: 3.1 10*3/uL (ref 0.7–4.0)
MCH: 28.8 pg (ref 26.0–34.0)
MCHC: 33.5 g/dL (ref 30.0–36.0)
MCV: 85.9 fL (ref 78.0–100.0)
Monocytes Absolute: 1.2 10*3/uL — ABNORMAL HIGH (ref 0.1–1.0)
Monocytes Relative: 11 %
Neutro Abs: 6.3 10*3/uL (ref 1.7–7.7)
Neutrophils Relative %: 57 %
Platelets: 266 10*3/uL (ref 150–400)
RBC: 4.9 MIL/uL (ref 4.22–5.81)
RDW: 13 % (ref 11.5–15.5)
WBC: 11 10*3/uL — ABNORMAL HIGH (ref 4.0–10.5)

## 2018-05-08 LAB — URINALYSIS, ROUTINE W REFLEX MICROSCOPIC
BILIRUBIN URINE: NEGATIVE
Glucose, UA: NEGATIVE mg/dL
Ketones, ur: NEGATIVE mg/dL
Nitrite: NEGATIVE
Protein, ur: 30 mg/dL — AB
RBC / HPF: >50 RBC/hpf — ABNORMAL HIGH (ref 0–5)
SPECIFIC GRAVITY, URINE: 1.031 — AB (ref 1.005–1.030)
pH: 5 (ref 5.0–8.0)

## 2018-05-08 SURGERY — CYSTOSCOPY, WITH RETROGRADE PYELOGRAM AND URETERAL STENT INSERTION
Anesthesia: General | Site: Ureter | Laterality: Bilateral

## 2018-05-08 MED ORDER — DEXAMETHASONE SODIUM PHOSPHATE 10 MG/ML IJ SOLN
INTRAMUSCULAR | Status: DC | PRN
  Administered 2018-05-08: 10 mg via INTRAVENOUS

## 2018-05-08 MED ORDER — ONDANSETRON 4 MG PO TBDP
4.0000 mg | ORAL_TABLET | Freq: Once | ORAL | Status: AC
  Administered 2018-05-08: 4 mg via ORAL
  Filled 2018-05-08: qty 1

## 2018-05-08 MED ORDER — LIDOCAINE 2% (20 MG/ML) 5 ML SYRINGE
INTRAMUSCULAR | Status: DC | PRN
  Administered 2018-05-08: 100 mg via INTRAVENOUS

## 2018-05-08 MED ORDER — DEXAMETHASONE SODIUM PHOSPHATE 10 MG/ML IJ SOLN
INTRAMUSCULAR | Status: AC
  Filled 2018-05-08: qty 1

## 2018-05-08 MED ORDER — KETOROLAC TROMETHAMINE 30 MG/ML IJ SOLN
30.0000 mg | Freq: Once | INTRAMUSCULAR | Status: AC
  Administered 2018-05-08: 30 mg via INTRAMUSCULAR
  Filled 2018-05-08: qty 1

## 2018-05-08 MED ORDER — OXYCODONE-ACETAMINOPHEN 5-325 MG PO TABS
1.0000 | ORAL_TABLET | Freq: Once | ORAL | Status: AC
  Administered 2018-05-08: 1 via ORAL
  Filled 2018-05-08: qty 1

## 2018-05-08 MED ORDER — LACTATED RINGERS IV SOLN
INTRAVENOUS | Status: DC
  Administered 2018-05-08: 18:00:00 via INTRAVENOUS

## 2018-05-08 MED ORDER — ONDANSETRON HCL 4 MG/2ML IJ SOLN
INTRAMUSCULAR | Status: DC | PRN
  Administered 2018-05-08: 4 mg via INTRAVENOUS

## 2018-05-08 MED ORDER — FENTANYL CITRATE (PF) 100 MCG/2ML IJ SOLN: 25.0000 ug | INTRAMUSCULAR | Status: DC | PRN

## 2018-05-08 MED ORDER — KETOROLAC TROMETHAMINE 30 MG/ML IJ SOLN
30.0000 mg | Freq: Once | INTRAMUSCULAR | Status: AC | PRN
  Administered 2018-05-08: 30 mg via INTRAVENOUS

## 2018-05-08 MED ORDER — OXYCODONE HCL 5 MG PO TABS
5.0000 mg | ORAL_TABLET | Freq: Once | ORAL | Status: AC
  Administered 2018-05-08: 5 mg via ORAL

## 2018-05-08 MED ORDER — KETOROLAC TROMETHAMINE 30 MG/ML IJ SOLN
INTRAMUSCULAR | Status: AC
  Filled 2018-05-08: qty 1

## 2018-05-08 MED ORDER — IOHEXOL 300 MG/ML  SOLN
INTRAMUSCULAR | Status: DC | PRN
  Administered 2018-05-08: 50 mL via URETHRAL

## 2018-05-08 MED ORDER — MIDAZOLAM HCL 2 MG/2ML IJ SOLN
INTRAMUSCULAR | Status: AC
  Filled 2018-05-08: qty 2

## 2018-05-08 MED ORDER — TAMSULOSIN HCL 0.4 MG PO CAPS: 0.4000 mg | ORAL_CAPSULE | Freq: Every day | ORAL | 3 refills | Status: AC

## 2018-05-08 MED ORDER — OXYCODONE HCL 5 MG PO TABS
ORAL_TABLET | ORAL | Status: DC
Start: 2018-05-08 — End: 2018-05-09
  Filled 2018-05-08: qty 1

## 2018-05-08 MED ORDER — LIDOCAINE 2% (20 MG/ML) 5 ML SYRINGE
INTRAMUSCULAR | Status: AC
  Filled 2018-05-08: qty 5

## 2018-05-08 MED ORDER — PROPOFOL 10 MG/ML IV BOLUS
INTRAVENOUS | Status: AC
  Filled 2018-05-08: qty 20

## 2018-05-08 MED ORDER — OXYCODONE HCL 5 MG PO TABS: 5.0000 mg | ORAL_TABLET | Freq: Four times a day (QID) | ORAL | 0 refills | Status: AC | PRN

## 2018-05-08 MED ORDER — FENTANYL CITRATE (PF) 100 MCG/2ML IJ SOLN
INTRAMUSCULAR | Status: AC
  Filled 2018-05-08: qty 2

## 2018-05-08 MED ORDER — FENTANYL CITRATE (PF) 100 MCG/2ML IJ SOLN
INTRAMUSCULAR | Status: DC | PRN
  Administered 2018-05-08 (×2): 50 ug via INTRAVENOUS

## 2018-05-08 MED ORDER — CEFAZOLIN SODIUM-DEXTROSE 2-3 GM-%(50ML) IV SOLR
INTRAVENOUS | Status: DC | PRN
Start: 2018-05-08 — End: 2018-05-08
  Administered 2018-05-08: 2 g via INTRAVENOUS

## 2018-05-08 MED ORDER — CEFAZOLIN SODIUM-DEXTROSE 2-4 GM/100ML-% IV SOLN
INTRAVENOUS | Status: AC
  Filled 2018-05-08: qty 100

## 2018-05-08 MED ORDER — MIDAZOLAM HCL 5 MG/5ML IJ SOLN
INTRAMUSCULAR | Status: DC | PRN
  Administered 2018-05-08: 2 mg via INTRAVENOUS

## 2018-05-08 MED ORDER — PROMETHAZINE HCL 25 MG/ML IJ SOLN: 6.2500 mg | INTRAMUSCULAR | Status: DC | PRN

## 2018-05-08 MED ORDER — PROPOFOL 10 MG/ML IV BOLUS
INTRAVENOUS | Status: DC | PRN
  Administered 2018-05-08: 200 mg via INTRAVENOUS

## 2018-05-08 MED ORDER — ONDANSETRON HCL 4 MG/2ML IJ SOLN
INTRAMUSCULAR | Status: AC
  Filled 2018-05-08: qty 2

## 2018-05-08 SURGICAL SUPPLY — 14 items
BAG URO CATCHER STRL LF (MISCELLANEOUS) ×3 IMPLANT
CATH INTERMIT  6FR 70CM (CATHETERS) ×3 IMPLANT
CLOTH BEACON ORANGE TIMEOUT ST (SAFETY) ×3 IMPLANT
COVER FOOTSWITCH UNIV (MISCELLANEOUS) IMPLANT
COVER SURGICAL LIGHT HANDLE (MISCELLANEOUS) ×3 IMPLANT
FIBER LASER TRAC TIP (UROLOGICAL SUPPLIES) ×3 IMPLANT
GLOVE BIO SURGEON STRL SZ7.5 (GLOVE) ×3 IMPLANT
GOWN STRL REUS W/TWL LRG LVL3 (GOWN DISPOSABLE) ×6 IMPLANT
GUIDEWIRE STR DUAL SENSOR (WIRE) ×3 IMPLANT
MANIFOLD NEPTUNE II (INSTRUMENTS) ×3 IMPLANT
PACK CYSTO (CUSTOM PROCEDURE TRAY) ×3 IMPLANT
STENT URET 6FRX24 CONTOUR (STENTS) ×6 IMPLANT
TUBING CONNECTING 10 (TUBING) ×2 IMPLANT
TUBING CONNECTING 10' (TUBING) ×1

## 2018-05-08 NOTE — Anesthesia Procedure Notes (Signed)
Procedure Name: LMA Insertion Date/Time: 05/08/2018 7:29 PM Performed by: Epimenio SarinJarvela, Lakyn Mantione R, CRNA Pre-anesthesia Checklist: Patient identified, Emergency Drugs available, Suction available, Patient being monitored and Timeout performed Patient Re-evaluated:Patient Re-evaluated prior to induction Oxygen Delivery Method: Circle system utilized Preoxygenation: Pre-oxygenation with 100% oxygen Induction Type: IV induction LMA: LMA with gastric port inserted LMA Size: 4.0 Number of attempts: 1 Dental Injury: Teeth and Oropharynx as per pre-operative assessment

## 2018-05-08 NOTE — ED Provider Notes (Signed)
[facility] EMERGENCY DEPARTMENT Provider Note   CSN: [id number] Arrival date & time: 05/08/18  0744    History   Chief Complaint Chief Complaint  Patient presents with  . Flank Pain  . Nausea    HPI Malikai Gut is a 27 y.o. male.  HPI    27 year old male presents today with complaints of right-sided flank pain.  Patient notes a history of kidney stones in the past.  He notes off-and-on pain most recently returned this morning with pain in the right flank, unable to describe this.  Patient denies any fever, vomiting, lower abdominal pain or dysuria.  Patient does note some nausea.  No medications prior to arrival.    Past Medical History:  Diagnosis Date  . Asthma   . HIV (human immunodeficiency virus infection) (HCC)   . Migraines     Patient Active Problem List   Diagnosis Date Noted  . HIV disease (HCC) 04/10/2018    History reviewed. No pertinent surgical history.      Home Medications    Prior to Admission medications   Medication Sig Start Date End Date Taking? Authorizing Provider  benzonatate (TESSALON) 100 MG capsule Take 2 capsules (200 mg total) by mouth 3 (three) times daily as needed for cough. Swallow whole, do not chew 01/24/18   Triplett, Tammy, PA-C  ciprofloxacin (CIPRO) 500 MG tablet Take 1 tablet (500 mg total) by mouth 2 (two) times daily. One po bid x 7 days 01/08/18   Geoffery Lyons, MD  doxycycline (VIBRAMYCIN) 100 MG capsule Take 1 capsule (100 mg total) by mouth 2 (two) times daily. One po bid x 7 days 03/25/18   Fayrene Helper, PA-C  fluticasone [facility]) 50 MCG/ACT nasal spray Place 2 sprays into both nostrils daily. 02/19/18   Fawze, Mina A, PA-C  HYDROcodone-acetaminophen (NORCO) 5-325 MG tablet Take 1-2 tablets by mouth every 6 (six) hours as needed. 01/08/18   Geoffery Lyons, MD  ibuprofen (ADVIL,MOTRIN) 600 MG tablet Take 1 tablet (600 mg total) by mouth every 6 (six) hours as needed. 03/25/18   Fayrene Helper, PA-C    predniSONE (DELTASONE) 20 MG tablet Take 2 tablets (40 mg total) by mouth daily. For 5 days 01/24/18   Pauline Aus, PA-C  tamsulosin (FLOMAX) 0.4 MG CAPS capsule Take 1 capsule (0.4 mg total) by mouth daily. 03/25/18   Fayrene Helper, PA-C    Family History No family history on file.  Social History Social History   Tobacco Use  . Smoking status: Current Every Day Smoker    Packs/day: 0.50    Types: Cigarettes  . Smokeless tobacco: Never Used  Substance Use Topics  . Alcohol use: Yes    Alcohol/week: 1.2 oz    Types: 2 Shots of liquor per week  . Drug use: Yes    Frequency: 2.0 times per week    Types: Marijuana     Allergies   Latex   Review of Systems Review of Systems  All other systems reviewed and are negative.    Physical Exam Updated Vital Signs BP 113/71 (BP Location: Right Arm)   Pulse 79   Temp 98.3 F (36.8 C) (Oral)   Resp 17   Ht 5\' 6"  (1.676 m)   Wt 73.9 kg (163 lb)   SpO2 99%   BMI 26.31 kg/m   Physical Exam  Constitutional: He is oriented to person, place, and time. He appears well-developed and well-nourished.  HENT:  Head: Normocephalic and atraumatic.  Eyes:  Pupils are equal, round, and reactive to light. Conjunctivae are normal. Right eye exhibits no discharge. Left eye exhibits no discharge. No scleral icterus.  Neck: Normal range of motion. No JVD present. No tracheal deviation present.  Pulmonary/Chest: Effort normal. No stridor.  Abdominal: Soft. He exhibits no distension and no mass. There is no tenderness. There is no rebound and no guarding. No hernia.  Musculoskeletal:  TTP to palpation of right lower lumbar musculature   Neurological: He is alert and oriented to person, place, and time. Coordination normal.  Psychiatric: He has a normal mood and affect. His behavior is normal. Judgment and thought content normal.  Nursing note and vitals reviewed.    ED Treatments / Results  Labs (all labs ordered are listed, but only  abnormal results are displayed) Labs Reviewed  URINALYSIS, ROUTINE W REFLEX MICROSCOPIC - Abnormal; Notable for the following components:      Result Value   APPearance HAZY (*)    Specific Gravity, Urine 1.031 (*)    Hgb urine dipstick LARGE (*)    Protein, ur 30 (*)    Leukocytes, UA MODERATE (*)    RBC / HPF >50 (*)    Bacteria, UA FEW (*)    All other components within normal limits  CBC WITH DIFFERENTIAL/PLATELET - Abnormal; Notable for the following components:   WBC 11.0 (*)    Monocytes Absolute 1.2 (*)    All other components within normal limits  COMPREHENSIVE METABOLIC PANEL - Abnormal; Notable for the following components:   Creatinine, Ser 1.36 (*)    Total Protein 8.4 (*)    ALT 13 (*)    All other components within normal limits  URINE CULTURE    EKG None  Radiology Ct Renal Stone Study  Result Date: 05/08/2018 CLINICAL DATA:  Awoke with RIGHT flank pain this morning, history of kidney stone in February, HIV, asthma EXAM: CT ABDOMEN AND PELVIS WITHOUT CONTRAST TECHNIQUE: Multidetector CT imaging of the abdomen and pelvis was performed following the standard protocol without IV contrast. COMPARISON:  01/08/2018 FINDINGS: Lower chest: Visualized lung bases clear Hepatobiliary: Superior liver partially excluded. Contracted gallbladder. Visualized liver normal appearance Pancreas: Grossly normal appearance Spleen: Normal appearance Adrenals/Urinary Tract: Adrenal glands normal appearance. Mild RIGHT hydronephrosis and proximal hydroureter secondary to a 4 mm mid RIGHT ureteral calculus image 40. A 5 mm diameter calculus is seen within the proximal LEFT ureter image 30, without significant hydronephrosis.; previously this calculus was located in the upper to mid LEFT kidney. No additional urinary tract calcification or dilatation. Bladder decompressed. Stomach/Bowel: Appendix not definitely visualized but no pericecal inflammatory process seen. Rectal wall thickening, in  retrospect unchanged. Stomach and remaining bowel loops normal Vascular/Lymphatic: Aorta normal caliber.  No adenopathy. Reproductive: Unremarkable prostate gland Other: No free air or free fluid.  No hernia. Musculoskeletal: Unremarkable IMPRESSION: Mild RIGHT hydronephrosis and proximal hydroureter secondary to a 4 mm mid RIGHT ureteral calculus. Additional 5 mm proximal LEFT ureteral calculus without hydronephrosis. Mild rectal wall thickening, appears chronic, question proctitis. Electronically Signed   By: Ulyses SouthwardMark  Boles M.D.   On: 05/08/2018 14:06    Procedures Procedures (including critical care time)  Medications Ordered in ED Medications  ketorolac (TORADOL) 30 MG/ML injection 30 mg (30 mg Intramuscular Given 05/08/18 1251)  ondansetron (ZOFRAN-ODT) disintegrating tablet 4 mg (4 mg Oral Given 05/08/18 1252)  oxyCODONE-acetaminophen (PERCOCET/ROXICET) 5-325 MG per tablet 1 tablet (1 tablet Oral Given 05/08/18 1458)     Initial Impression / Assessment and Plan /  ED Course  I have reviewed the triage vital signs and the nursing notes.  Pertinent labs & imaging results that were available during my care of the patient were reviewed by me and considered in my medical decision making (see chart for details).     Labs: CBC, CMP a UA, urine culture  Imaging: CT renal  Consults: Dr. Alvester Morin  Therapeutics: Toradol  Discharge Meds:   Assessment/Plan: Patient presents with bilateral ureterolithiasis, he does have obstructing stone on the right.  His urine concerning for infection with moderate leukocytes few bacteria and 21-50 white blood cells.  Urology was consulted who recommended OR management.  Patient given a cab voucher he will go straight to [employer] today for or management.  He is well-appearing in no acute distress, minor elevation in white count or fever no signs of systemic illness.  Patient is reasonable and understands the need for our management, he assures me that he will  go straight to [place] long.    Final Clinical Impressions(s) / ED Diagnoses   Final diagnoses:  Ureterolithiasis    ED Discharge Orders    None       Rosalio Loud 05/08/18 1605    Long, Arlyss Repress, MD 05/09/18 [id number]

## 2018-05-08 NOTE — ED Notes (Signed)
Patient verbalized understanding of d/c instructions - cab voucher given and RN assisted patient into cab. Patient does still have IV - spoke with [facility] and [facility] to notify them patient is on his way.

## 2018-05-08 NOTE — Discharge Instructions (Addendum)

## 2018-05-08 NOTE — ED Triage Notes (Signed)
Pt. Stated, Ive had kidney stones since the 1st part of the year and it was [facility]. I can feel it coming back. Im also nausea

## 2018-05-08 NOTE — Transfer of Care (Signed)
Immediate Anesthesia Transfer of Care Note  Patient: John Navarro  Procedure(s) Performed: CYSTOSCOPY WITH RETROGRADE PYELOGRAM/URETERAL STENT PLACEMENt bilateral (Bilateral Ureter)  Patient Location: PACU  Anesthesia Type:General  Level of Consciousness: awake, alert , oriented and patient cooperative  Airway & Oxygen Therapy: Patient Spontanous Breathing  Post-op Assessment: Report given to RN and Post -op Vital signs reviewed and stable  Post vital signs: Reviewed and stable  Last Vitals:  Vitals Value Taken Time  BP    Temp    Pulse 75 05/08/2018  8:33 PM  Resp 19 05/08/2018  8:33 PM  SpO2 100 % 05/08/2018  8:33 PM  Vitals shown include unvalidated device data.  Last Pain:  Vitals:   05/08/18 1758  TempSrc:   PainSc: 8          Complications: No apparent anesthesia complications

## 2018-05-08 NOTE — H&P (Addendum)
H&P  Chief Complaint: Bilateral ureteral calculi  History of Present Illness: 27 year old Navarro with a history of HIV presented with flank pain.  CT scan was performed which showed bilateral ureteral calculi.  He had very mild AKI of 1.36.  WBC 11.0.  Urinalysis revealed moderate leukocytes, Nitrite, few bacteria, and 21-50 WBCs.  He continues to have flank pain.  He denies any voiding symptoms.  He has been afebrile with vital signs stable.  Past Medical History:  Diagnosis Date  . Asthma   . Dyspnea    environmental; increased exertion   . History of bronchitis   . History of kidney stones   . HIV (human immunodeficiency virus infection) (HCC)   . Migraines   . Pneumonia    Past Surgical History:  Procedure Laterality Date  . NO PAST SURGERIES      Home Medications:  Medications Prior to Admission  Medication Sig Dispense Refill Last Dose  . bictegravir-emtricitabine-tenofovir AF (BIKTARVY) 50-200-25 MG TABS tablet Take 1 tablet by mouth daily.   05/07/2018 at Unknown time  . ibuprofen (ADVIL,MOTRIN) 200 MG tablet Take 400 mg by mouth every 6 (six) hours as needed for headache.   05/07/2018 at Unknown time  . benzonatate (TESSALON) 100 MG capsule Take 2 capsules (200 mg total) by mouth 3 (three) times daily as needed for cough. Swallow whole, do not chew (Patient not taking: Reported on 05/08/2018) 21 capsule 0 Not Taking at Unknown time  . ciprofloxacin (CIPRO) 500 MG tablet Take 1 tablet (500 mg total) by mouth 2 (two) times daily. One po bid x 7 days 14 tablet 0   . doxycycline (VIBRAMYCIN) 100 MG capsule Take 1 capsule (100 mg total) by mouth 2 (two) times daily. One po bid x 7 days 14 capsule 0   . fluticasone (FLONASE) 50 MCG/ACT nasal spray Place 2 sprays into both nostrils daily. (Patient not taking: Reported on 05/08/2018) 16 g 0 Completed Course at Unknown time  . HYDROcodone-acetaminophen (NORCO) 5-325 MG tablet Take 1-2 tablets by mouth every 6 (six) hours as needed. (Patient  not taking: Reported on 05/08/2018) 15 tablet 0 Not Taking at Unknown time  . ibuprofen (ADVIL,MOTRIN) 600 MG tablet Take 1 tablet (600 mg total) by mouth every 6 (six) hours as needed. (Patient not taking: Reported on 05/08/2018) 30 tablet 0 Not Taking at Unknown time  . predniSONE (DELTASONE) 20 MG tablet Take 2 tablets (40 mg total) by mouth daily. For 5 days 10 tablet 0   . tamsulosin (FLOMAX) 0.4 MG CAPS capsule Take 1 capsule (0.4 mg total) by mouth daily. 10 capsule 0    Allergies:  Allergies  Allergen Reactions  . Latex Hives    History reviewed. No pertinent family history. Social History:  reports that he has been smoking cigarettes.  He has been smoking about 0.50 packs per day. He has never used smokeless tobacco. He reports that he drinks about 1.2 oz of alcohol per week. He reports that he has current or past drug history. Drug: Marijuana. Frequency: 2.00 times per week.  ROS: A complete review of systems was performed.  All systems are negative except for pertinent findings as noted. ROS   Physical Exam:  Vital signs in last 24 hours: Temp:  [98 F (36.7 C)-98.3 F (36.8 C)] 98 F (36.7 C) (06/11 1646) Pulse Rate:  [66-85] 66 (06/11 1646) Resp:  [16-20] 18 (06/11 1646) BP: (109-125)/(71-92) 125/85 (06/11 1646) SpO2:  [94 %-99 %] 98 % (06/11 1646) Weight:  [  72.5 kg (159 lb 12.8 oz)-73.9 kg (163 lb)] 72.5 kg (159 lb 12.8 oz) (06/11 1646) General:  Alert and oriented, No acute distress HEENT: Normocephalic, atraumatic Neck: No JVD or lymphadenopathy Cardiovascular: Regular rate and rhythm Lungs: Regular rate and effort Abdomen: Soft, nontender, nondistended, no abdominal masses Back: No CVA tenderness Extremities: No edema Neurologic: Grossly intact  Laboratory Data:  Results for orders placed or performed during the hospital encounter of 05/08/18 (from the past 24 hour(s))  Urinalysis, Routine w reflex microscopic     Status: Abnormal   Collection Time: 05/08/18   8:15 AM  Result Value Ref Range   Color, Urine YELLOW YELLOW   APPearance HAZY (A) CLEAR   Specific Gravity, Urine 1.031 (H) 1.005 - 1.030   pH 5.0 5.0 - 8.0   Glucose, UA NEGATIVE NEGATIVE mg/dL   Hgb urine dipstick LARGE (A) NEGATIVE   Bilirubin Urine NEGATIVE NEGATIVE   Ketones, ur NEGATIVE NEGATIVE mg/dL   Protein, ur 30 (A) NEGATIVE mg/dL   Nitrite NEGATIVE NEGATIVE   Leukocytes, UA MODERATE (A) NEGATIVE   RBC / HPF >50 (H) 0 - 5 RBC/hpf   WBC, UA 21-50 0 - 5 WBC/hpf   Bacteria, UA FEW (A) NONE SEEN   Squamous Epithelial / LPF 0-5 0 - 5   Mucus PRESENT    Ca Oxalate Crys, UA PRESENT   CBC with Differential     Status: Abnormal   Collection Time: 05/08/18  1:05 PM  Result Value Ref Range   WBC 11.0 (H) 4.0 - 10.5 K/uL   RBC 4.90 4.22 - 5.81 MIL/uL   Hemoglobin 14.1 13.0 - 17.0 g/dL   HCT 16.1 09.6 - 04.5 %   MCV 85.9 78.0 - 100.0 fL   MCH 28.8 26.0 - 34.0 pg   MCHC 33.5 30.0 - 36.0 g/dL   RDW 40.9 81.1 - 91.4 %   Platelets 266 150 - 400 K/uL   Neutrophils Relative % 57 %   Neutro Abs 6.3 1.7 - 7.7 K/uL   Lymphocytes Relative 28 %   Lymphs Abs 3.1 0.7 - 4.0 K/uL   Monocytes Relative 11 %   Monocytes Absolute 1.2 (H) 0.1 - 1.0 K/uL   Eosinophils Relative 3 %   Eosinophils Absolute 0.3 0.0 - 0.7 K/uL   Basophils Relative 1 %   Basophils Absolute 0.1 0.0 - 0.1 K/uL   Immature Granulocytes 0 %   Abs Immature Granulocytes 0.0 0.0 - 0.1 K/uL  Comprehensive metabolic panel     Status: Abnormal   Collection Time: 05/08/18  1:05 PM  Result Value Ref Range   Sodium 141 135 - 145 mmol/L   Potassium 3.8 3.5 - 5.1 mmol/L   Chloride 110 101 - 111 mmol/L   CO2 24 22 - 32 mmol/L   Glucose, Bld 93 65 - 99 mg/dL   BUN 9 6 - 20 mg/dL   Creatinine, Ser 7.82 (H) 0.61 - 1.24 mg/dL   Calcium 9.2 8.9 - 95.6 mg/dL   Total Protein 8.4 (H) 6.5 - 8.1 g/dL   Albumin 3.8 3.5 - 5.0 g/dL   AST 18 15 - 41 U/L   ALT 13 (L) 17 - 63 U/L   Alkaline Phosphatase 54 38 - 126 U/L   Total  Bilirubin 0.6 0.3 - 1.2 mg/dL   GFR calc non Af Amer >60 >60 mL/min   GFR calc Af Amer >60 >60 mL/min   Anion gap 7 5 - 15   No results found  for this or any previous visit (from the past 240 hour(s)). Creatinine: Recent Labs    05/08/18 1305  CREATININE 1.36*   CT scan personally reviewed: 4 mm right ureteral calculus, 5 mm left ureteral calculus  Impression/Assessment:  Bilateral ureteral calculi Right hydronephrosis secondary to ureteral obstruction  Plan:  Proceed with bilateral ureteroscopy, laser trip C, ureteral stent placement.  He understands potential risks including but not limited to bleeding, infection, injury to surrounding structures including possible ureteral evulsion, potential need for additional procedures.  Ray ChurchEugene D Bell, III 05/08/2018, 7:03 PM

## 2018-05-08 NOTE — Op Note (Signed)
Operative Note  Preoperative diagnosis:  1.  Bilateral ureteral calculi  Postoperative diagnosis: 1.  Bilateral ureteral calculi  Procedure(s): 1.  Cystoscopy with bilateral retrograde pyelogram with interpretation, bilateral ureteroscopy with laser lithotripsy and ureteral stent placement, fluoroscopy less than 1 hour  Surgeon: Modena Slater, MD  Assistants: None  Anesthesia: General  Complications: None immediate  EBL: Minimal  Specimens: 1.  None  Drains/Catheters: 1.  Bilateral 6 x 24 double-J ureteral stents  Intraoperative findings: 1.  Normal urethra and bladder 2.  Bilateral ureteral calculi fragmented to less than 1 mm fragments. 3.  Left retrograde pyelogram revealed mild hydronephrosis. 4.  Right retrograde pyelogram revealed mild hydronephrosis as well.  Indication: 27 year old male presented with acute right-sided flank pain and had a CT scan performed which revealed bilateral ureteral calculi.  Given this finding, the patient was taken urgently to the operating room for the previously mentioned operation.  Description of procedure:  The patient was identified and consent was obtained.  The patient was taken to the operating room and placed in the supine position.  The patient was placed under general anesthesia.  Perioperative antibiotics were administered.  The patient was placed in dorsal lithotomy.  Patient was prepped and draped in a standard sterile fashion and a timeout was performed.  A 21 French rigid cystoscope was advanced into the urethra and into the bladder.  Cystoscopy was performed with no abnormal findings.  The left ureter was cannulated with a sensor wire which was advanced up to the kidney under fluoroscopic guidance.  A semirigid ureteroscope was advanced alongside the wire up to the stone of interest which was fragmented to less than 1 mm fragments.  I advanced the ureteroscope up to the renal pelvis and no other ureteral calculi were seen.  I  performed a retrograde pyelogram through the scope with the findings noted above.  I then withdrew the scope and no clinically significant ureteral calculi were seen.  There was no significant ureteral trauma.  There was some edema at the level of stone impaction.  I backloaded the wire onto a rigid cystoscope and advanced that into the bladder.  I then placed a 6 x 24 double-J ureteral stent in a standard fashion followed by removal of the wire.  Fluoroscopy confirmed proximal placement and direct visualization confirmed a good coil within the bladder.  I then turned my attention to the right ureteral orifice.  This was cannulated with a sensor wire which was advanced up to the kidney under fluoroscopic guidance.  I then advanced a semirigid ureteroscope alongside the wire up to the stone of interest which was fragmented to less than 1 mm fragments.  I advanced the ureteroscope up to the renal pelvis and no other stone fragments were seen.  I shot a retrograde pyelogram through the scope with the findings noted above.  I then withdrew the scope.  There was significant ureteral edema at the level stone impaction.  There were no other clinically significant stone fragments.  There was no ureteral trauma noted.  I then backloaded the wire onto a rigid cystoscope which was advanced into the bladder.  I placed a 6 x 24 double-J ureteral stent in a standard fashion followed by removal of the wire.  Fluoroscopy confirmed proximal placement and direct visualization confirmed a good coil within the bladder.  This concluded the operation.  The patient tolerated the procedure well and was stable postoperatively.  Plan: Follow-up in 1 week for ureteral stent removal with an  extender since I am out of town.  He can then follow-up with me in 6 weeks or so with a KUB and renal ultrasound.

## 2018-05-08 NOTE — Anesthesia Preprocedure Evaluation (Addendum)
Anesthesia Evaluation  Patient identified by MRN, date of birth, ID band Patient awake    Reviewed: Allergy & Precautions, NPO status , Patient's Chart, lab work & pertinent test results  Airway Mallampati: II  TM Distance: >3 FB Neck ROM: Full    Dental no notable dental hx.  Tongue piercing:   Pulmonary Current Smoker,    Pulmonary exam normal breath sounds clear to auscultation       Cardiovascular negative cardio ROS Normal cardiovascular exam Rhythm:Regular Rate:Normal     Neuro/Psych negative neurological ROS  negative psych ROS   GI/Hepatic negative GI ROS, Neg liver ROS,   Endo/Other  negative endocrine ROS  Renal/GU negative Renal ROS  negative genitourinary   Musculoskeletal negative musculoskeletal ROS (+)   Abdominal Belly button ring  Peds negative pediatric ROS (+)  Hematology  (+) HIV,   Anesthesia Other Findings   Reproductive/Obstetrics negative OB ROS                            Anesthesia Physical Anesthesia Plan  ASA: III  Anesthesia Plan: General   Post-op Pain Management:    Induction: Intravenous  PONV Risk Score and Plan: 2 and Ondansetron, Dexamethasone and Treatment may vary due to age or medical condition  Airway Management Planned: LMA  Additional Equipment:   Intra-op Plan:   Post-operative Plan: Extubation in OR  Informed Consent: I have reviewed the patients History and Physical, chart, labs and discussed the procedure including the risks, benefits and alternatives for the proposed anesthesia with the patient or authorized representative who has indicated his/her understanding and acceptance.   Dental advisory given  Plan Discussed with: CRNA and Surgeon  Anesthesia Plan Comments:         Anesthesia Quick Evaluation

## 2018-05-09 ENCOUNTER — Encounter (HOSPITAL_COMMUNITY): Payer: Self-pay | Admitting: Urology

## 2018-05-09 LAB — URINE CULTURE: Culture: <10000 — AB

## 2018-05-10 NOTE — Anesthesia Postprocedure Evaluation (Signed)
Anesthesia Post Note  Patient: John Navarro  Procedure(s) Performed: CYSTOSCOPY WITH RETROGRADE PYELOGRAM/URETERAL STENT PLACEMENt bilateral (Bilateral Ureter)     Patient location during evaluation: PACU Anesthesia Type: General Level of consciousness: awake and alert Pain management: pain level controlled Vital Signs Assessment: post-procedure vital signs reviewed and stable Respiratory status: spontaneous breathing, nonlabored ventilation, respiratory function stable and patient connected to nasal cannula oxygen Cardiovascular status: blood pressure returned to baseline and stable Postop Assessment: no apparent nausea or vomiting Anesthetic complications: no    Last Vitals:  Vitals:   05/08/18 2145 05/08/18 2200  BP: (!) 131/93 (!) 132/100  Pulse: 80   Resp: 16 15  Temp: 36.4 C   SpO2: 100% 100%    Last Pain:  Vitals:   05/08/18 2145  TempSrc:   PainSc: 4                  Zelena Bushong S

## 2018-05-22 ENCOUNTER — Ambulatory Visit: Payer: Self-pay

## 2018-05-22 ENCOUNTER — Other Ambulatory Visit: Payer: Self-pay

## 2018-05-28 ENCOUNTER — Ambulatory Visit: Payer: Self-pay

## 2018-05-28 ENCOUNTER — Encounter: Payer: Self-pay | Admitting: Internal Medicine

## 2019-01-17 IMAGING — DX DG CHEST 2V
2 series · 2 of 2 positions shown · non-contrast
Comparison: None.

CLINICAL DATA: Cough, congestion, sob, body aches and fever x 2
days.

EXAM:
CHEST  2 VIEW

[chest pa]
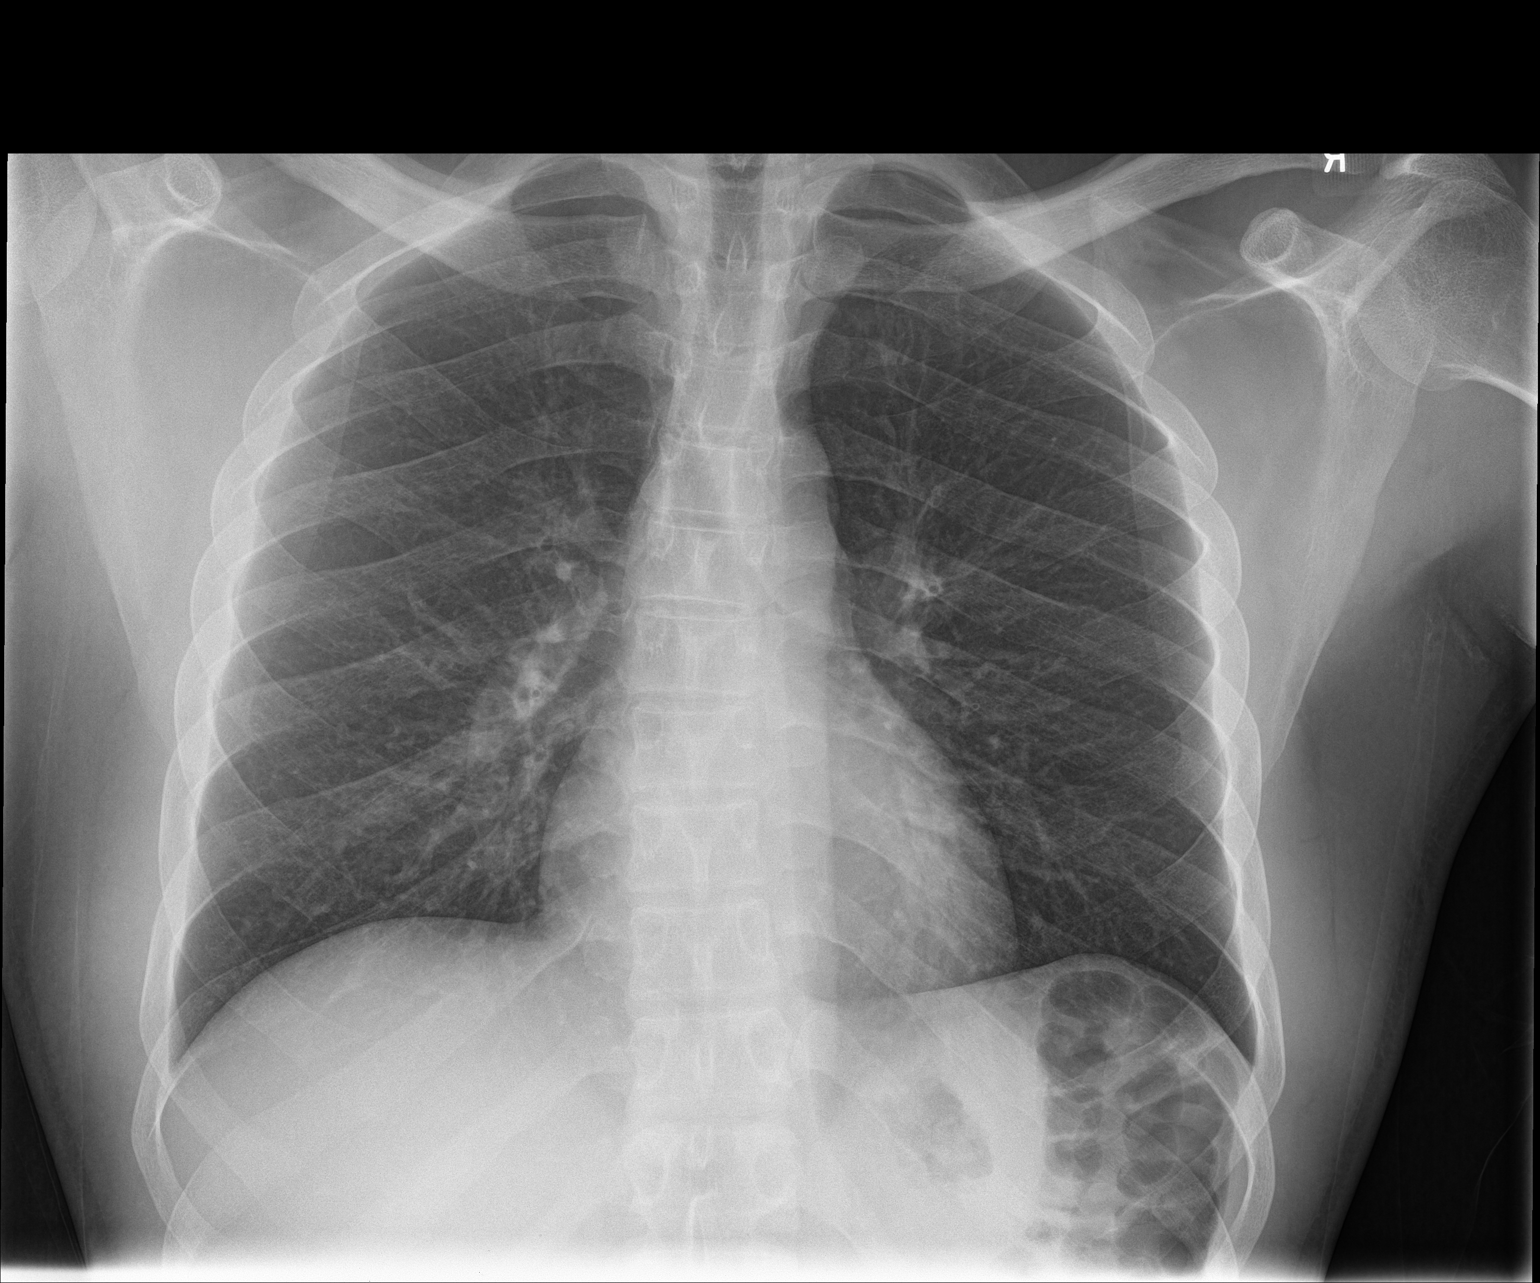

[chest lat]
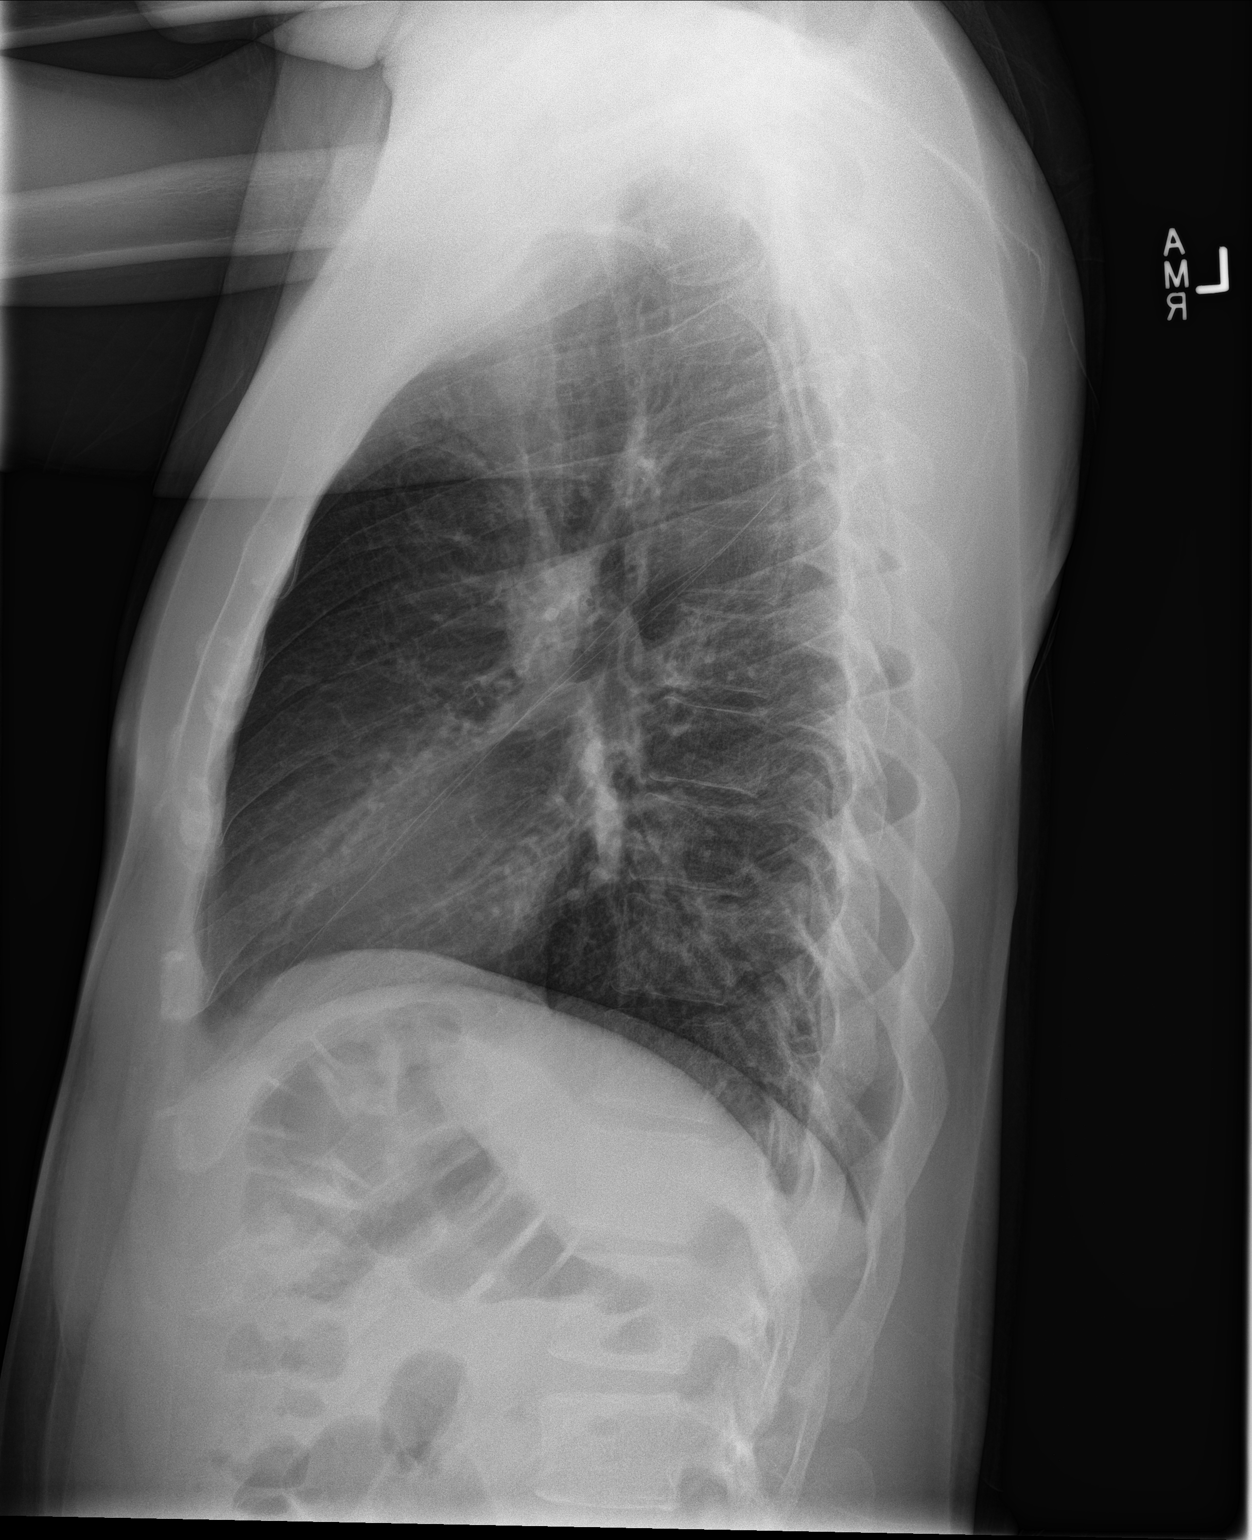

[2 of 2 positions shown; findings below may reference images not displayed]

FINDINGS: Heart size is normal. There is mild perihilar peribronchial
thickening. No focal consolidations or pleural effusions. No
pulmonary edema.
IMPRESSION: Mild bronchitic changes.

## 2019-05-01 IMAGING — CT CT RENAL STONE PROTOCOL
2 of 4 series · 15 of 46 positions shown, 17 images · non-contrast
Comparison: 01/08/2018

CLINICAL DATA: Awoke with RIGHT flank pain this morning, history of
kidney stone in [REDACTED], HIV, asthma

EXAM:
CT ABDOMEN AND PELVIS WITHOUT CONTRAST
TECHNIQUE: Multidetector CT imaging of the abdomen and pelvis was performed
following the standard protocol without IV contrast.

[Series 3: renal stone 5mm · axial · 0.68mm/px · z∈[+1058,+1433]mm · 12 of 89 slices shown, 14 images]
[im 7/89  soft-tissue]
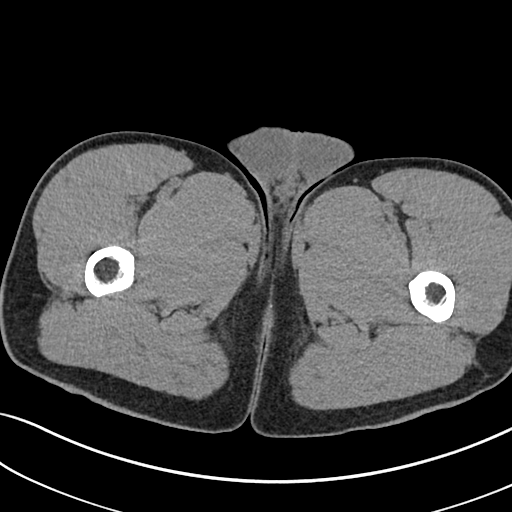
[im 7/89  bone]
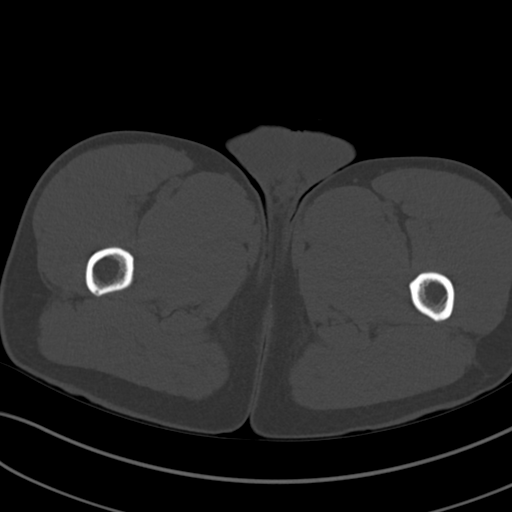
[im 13/89  soft-tissue]
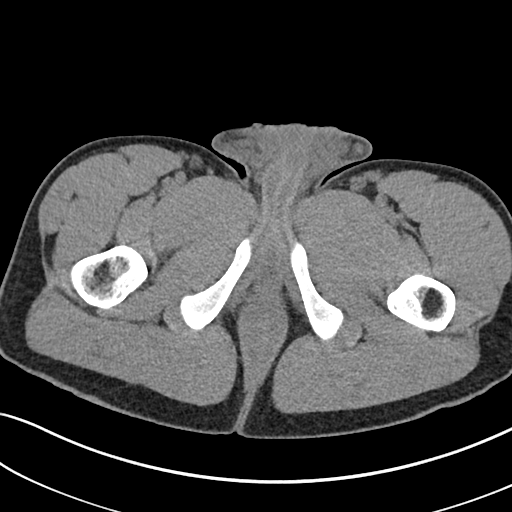
[im 19/89  soft-tissue]
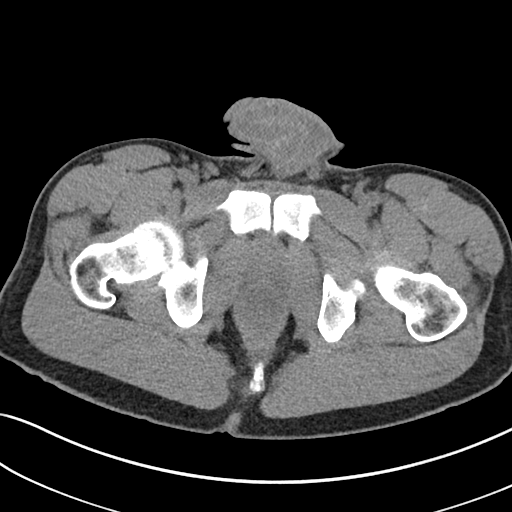
[im 26/89  soft-tissue]
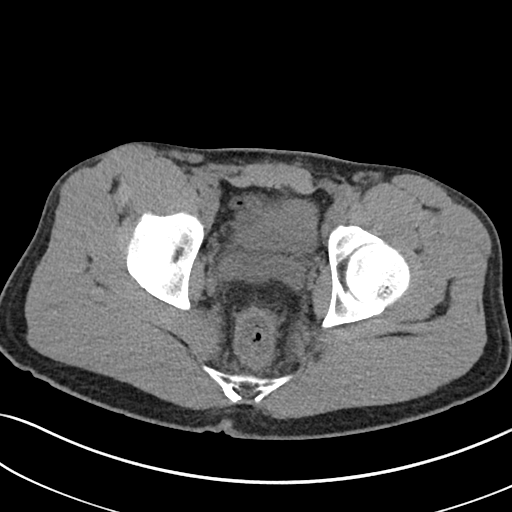
[im 35/89  soft-tissue]
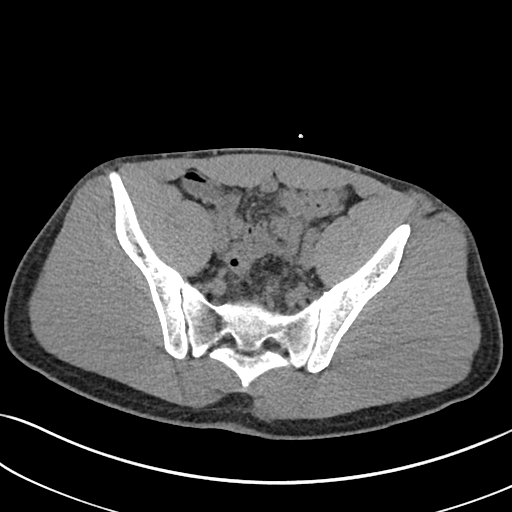
[im 41/89  soft-tissue]
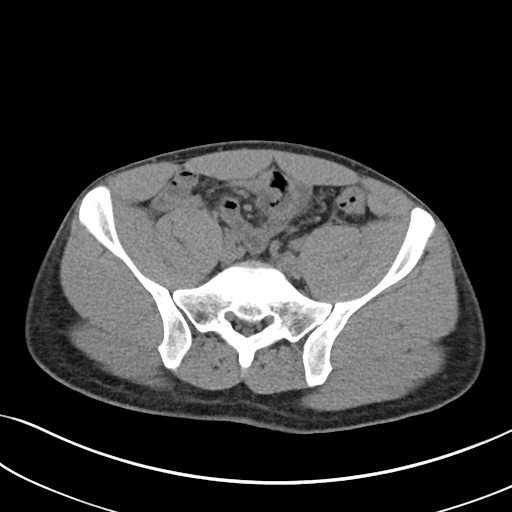
[im 48/89  soft-tissue]
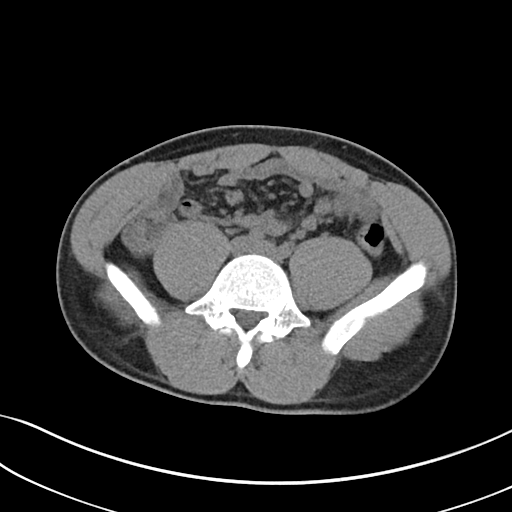
[im 54/89  soft-tissue]
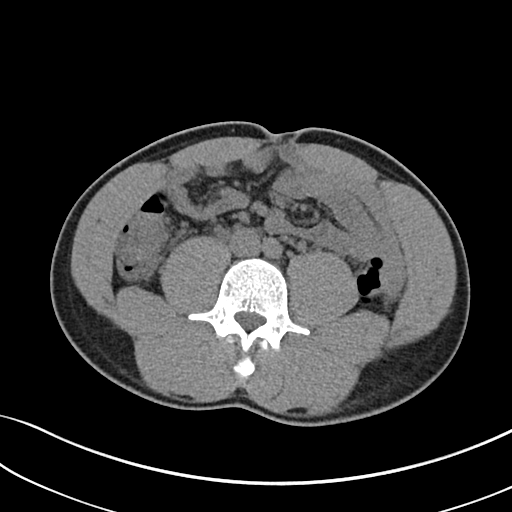
[im 63/89  soft-tissue]
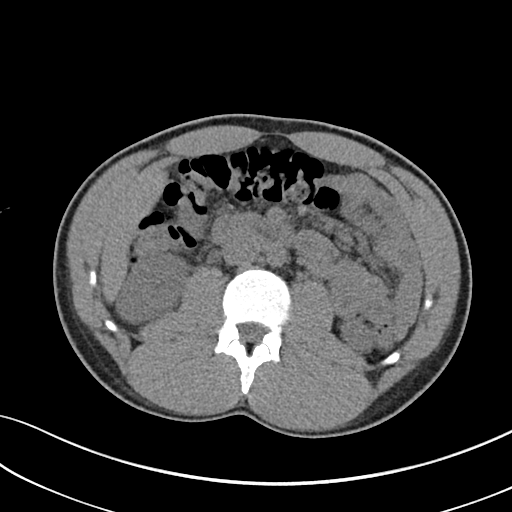
[im 63/89  bone]
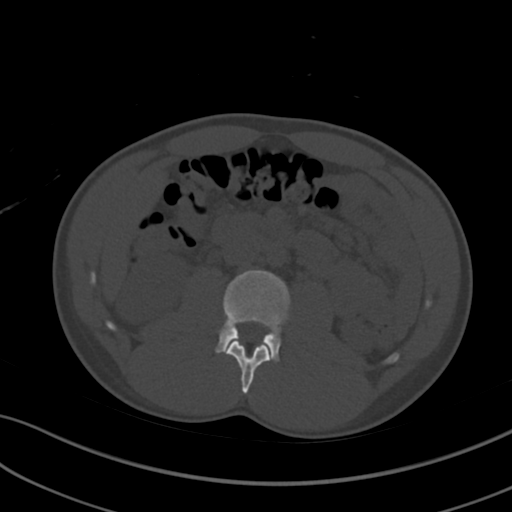
[im 70/89  soft-tissue]
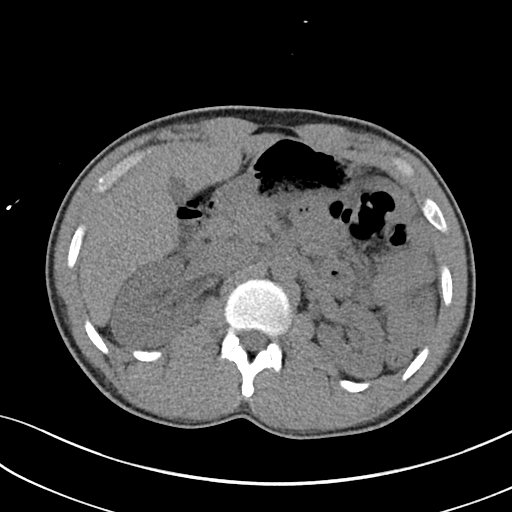
[im 76/89  soft-tissue]
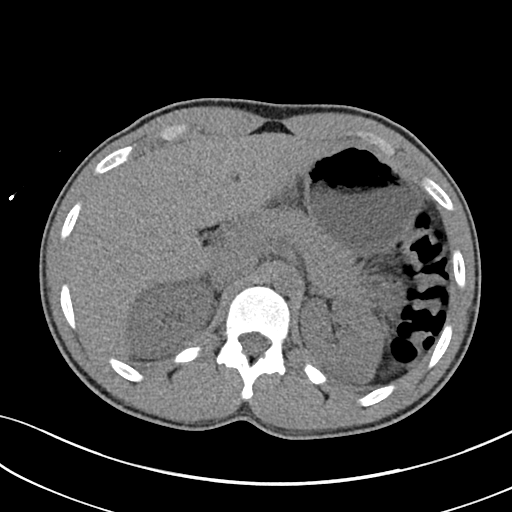
[im 82/89  soft-tissue]
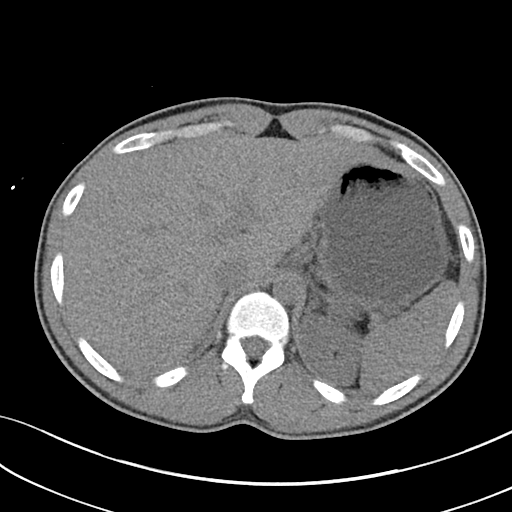

[Series 5: renal stone 3.0 cor · coronal · 0.59mm/px · 3 of 65 slices shown]
[im 22/65  soft-tissue]
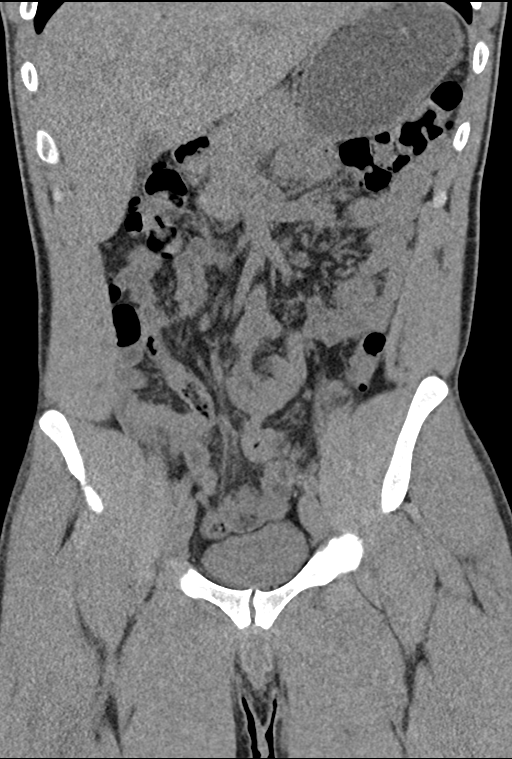
[im 29/65  soft-tissue]
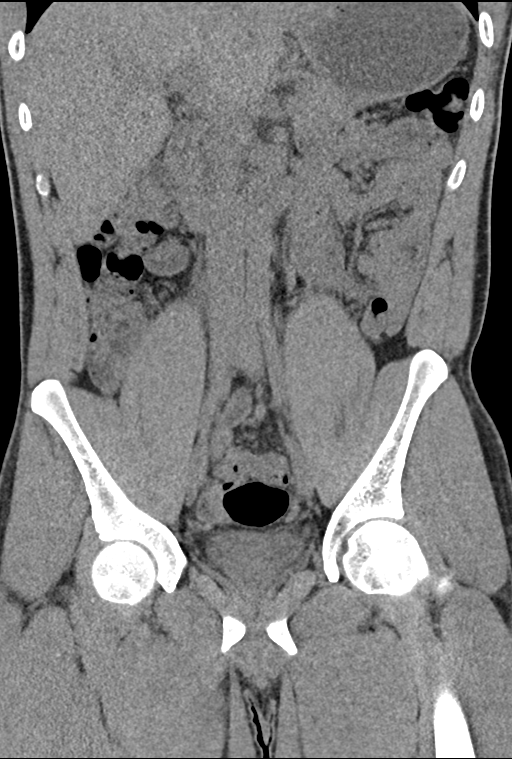
[im 36/65  soft-tissue]
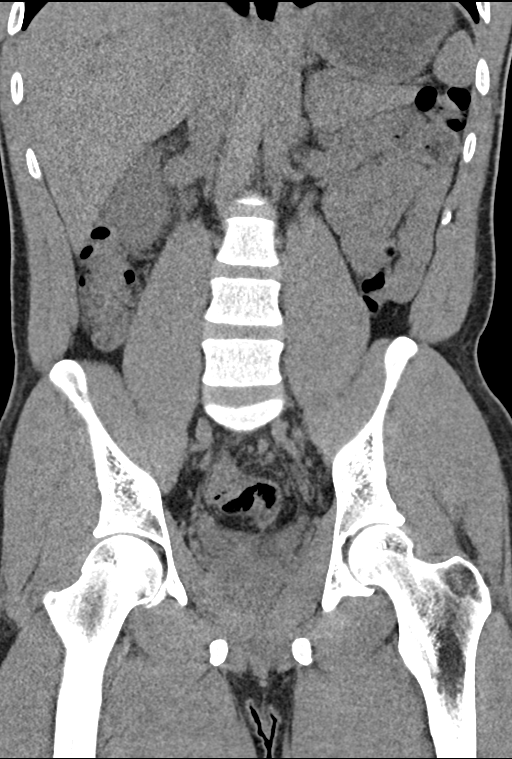

[15 of 46 positions shown; findings below may reference images not displayed]

FINDINGS: Lower chest: Visualized lung bases clear

Hepatobiliary: Superior liver partially excluded. Contracted
gallbladder. Visualized liver normal appearance

Pancreas: Grossly normal appearance

Spleen: Normal appearance

Adrenals/Urinary Tract: Adrenal glands normal appearance. Mild RIGHT
hydronephrosis and proximal hydroureter secondary to a 4 mm mid
RIGHT ureteral calculus image 40. A 5 mm diameter calculus is seen
within the proximal LEFT ureter image 30, without significant
hydronephrosis.; previously this calculus was located in the upper
to mid LEFT kidney. No additional urinary tract calcification or
dilatation. Bladder decompressed.

Stomach/Bowel: Appendix not definitely visualized but no pericecal
inflammatory process seen. Rectal wall thickening, in retrospect
unchanged. Stomach and remaining bowel loops normal

Vascular/Lymphatic: Aorta normal caliber.  No adenopathy.

Reproductive: Unremarkable prostate gland

Other: No free air or free fluid.  No hernia.

Musculoskeletal: Unremarkable
IMPRESSION: Mild RIGHT hydronephrosis and proximal hydroureter secondary to a 4
mm mid RIGHT ureteral calculus.

Additional 5 mm proximal LEFT ureteral calculus without
hydronephrosis.

Mild rectal wall thickening, appears chronic, question proctitis.
